# Patient Record
Sex: Male | Born: 1960 | Race: White | Hispanic: No | Marital: Married | Smoking: Former smoker
Health system: Southern US, Community
[De-identification: ages and names within clinical notes are randomized; demographics above are authoritative.]

## PROBLEM LIST (undated history)

## (undated) DIAGNOSIS — I7121 Aneurysm of the ascending aorta, without rupture: Secondary | ICD-10-CM

## (undated) DIAGNOSIS — I719 Aortic aneurysm of unspecified site, without rupture: Secondary | ICD-10-CM

## (undated) DIAGNOSIS — I5022 Chronic systolic (congestive) heart failure: Secondary | ICD-10-CM

## (undated) HISTORY — DX: Aneurysm of the ascending aorta, without rupture: I71.21

## (undated) HISTORY — DX: Aortic aneurysm of unspecified site, without rupture: I71.9

## (undated) HISTORY — DX: Chronic systolic (congestive) heart failure: I50.22

---

## 2014-04-09 ENCOUNTER — Emergency Department (HOSPITAL_COMMUNITY): Payer: BLUE CROSS/BLUE SHIELD

## 2014-04-09 ENCOUNTER — Inpatient Hospital Stay (HOSPITAL_COMMUNITY)
Admission: EM | Admit: 2014-04-09 | Discharge: 2014-04-22 | DRG: 216 | Disposition: A | Discharge status: Home / Self Care | Payer: BLUE CROSS/BLUE SHIELD | Attending: Surgery | Admitting: Surgery

## 2014-04-09 ENCOUNTER — Encounter (HOSPITAL_COMMUNITY): Payer: Self-pay | Admitting: *Deleted

## 2014-04-09 DIAGNOSIS — R0602 Shortness of breath: Secondary | ICD-10-CM | POA: Diagnosis not present

## 2014-04-09 DIAGNOSIS — I5031 Acute diastolic (congestive) heart failure: Secondary | ICD-10-CM | POA: Diagnosis present

## 2014-04-09 DIAGNOSIS — I712 Thoracic aortic aneurysm, without rupture, unspecified: Secondary | ICD-10-CM

## 2014-04-09 DIAGNOSIS — I517 Cardiomegaly: Secondary | ICD-10-CM | POA: Diagnosis present

## 2014-04-09 DIAGNOSIS — Z87891 Personal history of nicotine dependence: Secondary | ICD-10-CM

## 2014-04-09 DIAGNOSIS — J189 Pneumonia, unspecified organism: Secondary | ICD-10-CM | POA: Diagnosis present

## 2014-04-09 DIAGNOSIS — I77810 Thoracic aortic ectasia: Secondary | ICD-10-CM

## 2014-04-09 DIAGNOSIS — L259 Unspecified contact dermatitis, unspecified cause: Secondary | ICD-10-CM | POA: Diagnosis not present

## 2014-04-09 DIAGNOSIS — J90 Pleural effusion, not elsewhere classified: Secondary | ICD-10-CM

## 2014-04-09 DIAGNOSIS — R000 Tachycardia, unspecified: Secondary | ICD-10-CM | POA: Diagnosis present

## 2014-04-09 DIAGNOSIS — I272 Other secondary pulmonary hypertension: Secondary | ICD-10-CM | POA: Diagnosis present

## 2014-04-09 DIAGNOSIS — Z952 Presence of prosthetic heart valve: Secondary | ICD-10-CM

## 2014-04-09 DIAGNOSIS — Z6829 Body mass index (BMI) 29.0-29.9, adult: Secondary | ICD-10-CM

## 2014-04-09 DIAGNOSIS — D72829 Elevated white blood cell count, unspecified: Secondary | ICD-10-CM | POA: Diagnosis present

## 2014-04-09 DIAGNOSIS — I350 Nonrheumatic aortic (valve) stenosis: Secondary | ICD-10-CM

## 2014-04-09 DIAGNOSIS — I351 Nonrheumatic aortic (valve) insufficiency: Secondary | ICD-10-CM

## 2014-04-09 DIAGNOSIS — E669 Obesity, unspecified: Secondary | ICD-10-CM | POA: Diagnosis present

## 2014-04-09 LAB — CBC WITH DIFFERENTIAL/PLATELET
Basophils Absolute: 0 10*3/uL (ref 0.0–0.1)
Basophils Relative: 0 % (ref 0–1)
EOS ABS: 0.1 10*3/uL (ref 0.0–0.7)
EOS PCT: 1 % (ref 0–5)
HEMATOCRIT: 42.5 % (ref 39.0–52.0)
Hemoglobin: 14.4 g/dL (ref 13.0–17.0)
LYMPHS ABS: 2 10*3/uL (ref 0.7–4.0)
LYMPHS PCT: 23 % (ref 12–46)
MCH: 32.7 pg (ref 26.0–34.0)
MCHC: 33.9 g/dL (ref 30.0–36.0)
MCV: 96.4 fL (ref 78.0–100.0)
Monocytes Absolute: 0.8 10*3/uL (ref 0.1–1.0)
Monocytes Relative: 9 % (ref 3–12)
Neutro Abs: 5.7 10*3/uL (ref 1.7–7.7)
Neutrophils Relative %: 67 % (ref 43–77)
Platelets: 210 10*3/uL (ref 150–400)
RBC: 4.41 MIL/uL (ref 4.22–5.81)
RDW: 13.1 % (ref 11.5–15.5)
WBC: 8.6 10*3/uL (ref 4.0–10.5)

## 2014-04-09 LAB — BASIC METABOLIC PANEL
Anion gap: 6 (ref 5–15)
BUN: 14 mg/dL (ref 6–23)
CHLORIDE: 110 meq/L (ref 96–112)
CO2: 22 mmol/L (ref 19–32)
CREATININE: 0.97 mg/dL (ref 0.50–1.35)
Calcium: 9.1 mg/dL (ref 8.4–10.5)
GFR calc Af Amer: >90 mL/min (ref >90)
GFR calc non Af Amer: >90 mL/min (ref >90)
Glucose, Bld: 99 mg/dL (ref 70–99)
Potassium: 4.3 mmol/L (ref 3.5–5.1)
Sodium: 138 mmol/L (ref 135–145)

## 2014-04-09 LAB — BRAIN NATRIURETIC PEPTIDE: B NATRIURETIC PEPTIDE 5: 750 pg/mL — AB (ref 0.0–100.0)

## 2014-04-09 LAB — TROPONIN I: Troponin I: <0.03 ng/mL (ref <0.031)

## 2014-04-09 NOTE — ED Provider Notes (Addendum)
CSN: [id number]     Arrival date & time 04/09/14  2140 History  This chart was scribed for Vida Roller, MD by Annye Asa, ED Scribe. This patient was seen in room APA01/APA01 and the patient's care was started at 10:16 PM.    Chief Complaint  Patient presents with  . Cough   The history is provided by the patient. No language interpreter was used.     HPI Comments: Vincent [name] is an otherwise healthy 54 y.o. male who presents to the Emergency Department complaining of 3 weeks of gradually worsening dry cough. Patient was seen by [facility] at symptom onset, treated with z-pack and cough syrup; his symptoms did not improve. He was seen again by same [facility] today and had a CXR and CT done. The office called and told him to go to the ED due to the fluid in his lungs. He went to [facility] ED with this issue but could not wait, prompting him to come to [facility] ED.   Given Levaquin prior to arrival at [facility] - IV dose.  He denies SOB, fatigue, abnormal night sweats.   History reviewed. No pertinent past medical history. History reviewed. No pertinent past surgical history. History reviewed. No pertinent family history. History  Substance Use Topics  . Smoking status: Former Games developer  . Smokeless tobacco: Not on file  . Alcohol Use: Not on file    Review of Systems  Respiratory: Positive for cough.   All other systems reviewed and are negative.   Allergies  Review of patient's allergies indicates no known allergies.  Home Medications   Prior to Admission medications   Medication Sig Start Date End Date Taking? Authorizing Provider  cefTRIAXone (ROCEPHIN) 250 MG injection Inject 250 mg into the muscle once. FOR IM use in LARGE MUSCLE MASS   Yes Historical Provider, MD  dextromethorphan-guaiFENesin (MUCINEX DM) 30-600 MG per 12 hr tablet Take 1 tablet by mouth 2 (two) times daily as needed for cough.    Yes Historical Provider, MD  guaiFENesin-codeine (ROBITUSSIN AC) 100-10 MG/5ML syrup  Take 5 mLs by mouth 3 (three) times daily as needed for cough.   Yes Historical Provider, MD  levofloxacin (LEVAQUIN) 500 MG tablet Take 500 mg by mouth daily.   Yes Historical Provider, MD   BP 181/74 mmHg  Pulse 116  Temp(Src) 98.1 F (36.7 C) (Oral)  Resp 20  Ht  (1.702 m)  Wt 204 lb (92.534 kg)  BMI 31.94 kg/m2  SpO2 92% Physical Exam  Constitutional: He appears well-developed and well-nourished.  HENT:  Head: Normocephalic and atraumatic.  Eyes: Conjunctivae are normal. Right eye exhibits no discharge. Left eye exhibits no discharge.  Neck: Normal range of motion. Neck supple. No thyromegaly present.  Cardiovascular:  Mild tachypnea   Pulmonary/Chest: Effort normal. No respiratory distress.  Dullness to percussion base of R lung posterior hemithorax. Mild tachypnea.   Abdominal: Soft. Bowel sounds are normal. He exhibits no distension. There is no tenderness. There is no rebound.  Musculoskeletal: He exhibits no edema.  Lymphadenopathy:    He has no cervical adenopathy.  Neurological: He is alert. Coordination normal.  Skin: Skin is warm and dry. No rash noted. He is not diaphoretic. No erythema.  Psychiatric: He has a normal mood and affect.  Nursing note and vitals reviewed.   ED Course  Procedures   DIAGNOSTIC STUDIES: Oxygen Saturation is 92% on RA, low by my interpretation.    COORDINATION OF CARE: 10:32  PM Discussed treatment plan with pt at bedside and pt agreed to plan.  Labs Review Labs Reviewed  CBC WITH DIFFERENTIAL  BASIC METABOLIC PANEL    Imaging Review Dg Chest 2 View  04/09/2014   CLINICAL DATA:  Dry cough and congestion for 3 weeks. Patient was seen in [place] when symptoms first began and then again today. Increased shortness of breath tonight. Former smoker.  EXAM: CHEST  2 VIEW  COMPARISON:  None.  FINDINGS: Cardiac enlargement with mild pulmonary vascular congestion. Small bilateral pleural effusions. Patchy nodular opacities  in the lung bases suggest early edema. Findings most consistent with congestive failure although diffuse interstitial pneumonia could also have this appearance. No focal consolidation. No pneumothorax. Mediastinal contours appear intact.  IMPRESSION: Cardiac enlargement with mild pulmonary vascular congestion and probable nodular interstitial edema. Small bilateral pleural effusions.   Electronically Signed   By: Burman Nieves M.D.   On: 04/09/2014 23:18    EKG Interpretation  Date/Time:  Monday April 09 2014 23:35:22 EST Ventricular Rate:  103 PR Interval:  168 QRS Duration: 110 QT Interval:  380 QTC Calculation: 497 R Axis:   -60 Text Interpretation:  Sinus tachycardia Possible Left atrial enlargement Right bundle branch block Left anterior fascicular block  Bifascicular block   Abnormal ECG No old tracing to compare  Confirmed by Asa Baudoin  MD, Kanoe Wanner ([id number]) on 04/09/2014 11:47:41 PM       MDM   Final diagnoses:  SOB (shortness of breath)  Pleural effusion    Hypoxic with an oxygen of 92%, mildly hypertensive, I have reviewed his CT scan from the outside hospital, the report was sent and shows that there is patchy left upper and right lower lobe groundglass densities, also present or pleural effusions, they questioned patchy pulmonary edema versus infectious or inflammatory process. I discussed these findings with the hospitalist, Dr. Selena Batten, he will admit the patient to the hospital. A bedside ultrasound performed by me showed a right-sided pleural effusion.  Emergency Ultrasound: Limited Thoracic Performed and interpreted by Dr Hyacinth Meeker Longitudinal view of anterior left and right lung fields in real-time with linear probe. Indication: Dullness to percussion and SOB Findings: pleural effusion on the L Interpretation: no evidence of pneumothorax.  I personally performed the services described in this documentation, which was scribed in my presence. The recorded information has been  reviewed and is accurate.         Vida Roller, MD 04/09/14 1191  Vida Roller, MD 04/09/14 [phone]

## 2014-04-09 NOTE — ED Notes (Signed)
Pt was seen at his regular PCP and had an xray done; the office called and told pt to come to ED because he has fluid in his lungs; pt states he has a dry cough and has had a dry cough x 3 weeks

## 2014-04-10 ENCOUNTER — Encounter (HOSPITAL_COMMUNITY): Payer: Self-pay | Admitting: Internal Medicine

## 2014-04-10 DIAGNOSIS — I351 Nonrheumatic aortic (valve) insufficiency: Secondary | ICD-10-CM | POA: Diagnosis present

## 2014-04-10 DIAGNOSIS — I5031 Acute diastolic (congestive) heart failure: Secondary | ICD-10-CM | POA: Diagnosis present

## 2014-04-10 DIAGNOSIS — Z6829 Body mass index (BMI) 29.0-29.9, adult: Secondary | ICD-10-CM | POA: Diagnosis not present

## 2014-04-10 DIAGNOSIS — R000 Tachycardia, unspecified: Secondary | ICD-10-CM | POA: Diagnosis present

## 2014-04-10 DIAGNOSIS — I77810 Thoracic aortic ectasia: Secondary | ICD-10-CM | POA: Diagnosis present

## 2014-04-10 DIAGNOSIS — E669 Obesity, unspecified: Secondary | ICD-10-CM | POA: Diagnosis present

## 2014-04-10 DIAGNOSIS — L259 Unspecified contact dermatitis, unspecified cause: Secondary | ICD-10-CM | POA: Diagnosis not present

## 2014-04-10 DIAGNOSIS — R0602 Shortness of breath: Secondary | ICD-10-CM | POA: Insufficient documentation

## 2014-04-10 DIAGNOSIS — Z87891 Personal history of nicotine dependence: Secondary | ICD-10-CM | POA: Diagnosis not present

## 2014-04-10 DIAGNOSIS — I359 Nonrheumatic aortic valve disorder, unspecified: Secondary | ICD-10-CM

## 2014-04-10 DIAGNOSIS — J189 Pneumonia, unspecified organism: Secondary | ICD-10-CM | POA: Diagnosis present

## 2014-04-10 DIAGNOSIS — I712 Thoracic aortic aneurysm, without rupture: Secondary | ICD-10-CM | POA: Diagnosis present

## 2014-04-10 DIAGNOSIS — D72829 Elevated white blood cell count, unspecified: Secondary | ICD-10-CM | POA: Diagnosis present

## 2014-04-10 DIAGNOSIS — I272 Other secondary pulmonary hypertension: Secondary | ICD-10-CM | POA: Diagnosis present

## 2014-04-10 DIAGNOSIS — I517 Cardiomegaly: Secondary | ICD-10-CM | POA: Diagnosis present

## 2014-04-10 LAB — TSH: TSH: 2.761 u[IU]/mL (ref 0.350–4.500)

## 2014-04-10 LAB — MRSA PCR SCREENING: MRSA by PCR: NEGATIVE

## 2014-04-10 LAB — TROPONIN I
TROPONIN I: 0.03 ng/mL (ref <0.031)
Troponin I: <0.03 ng/mL (ref <0.031)

## 2014-04-10 MED ORDER — HYDROCOD POLST-CHLORPHEN POLST 10-8 MG/5ML PO LQCR: 5.0000 mL | Freq: Two times a day (BID) | ORAL | Status: DC | PRN

## 2014-04-10 MED ORDER — ONDANSETRON HCL 4 MG/2ML IJ SOLN: 4.0000 mg | Freq: Three times a day (TID) | INTRAMUSCULAR | Status: AC | PRN

## 2014-04-10 MED ORDER — SODIUM CHLORIDE 0.9 % IJ SOLN
3.0000 mL | Freq: Two times a day (BID) | INTRAMUSCULAR | Status: DC
  Administered 2014-04-11 – 2014-04-12 (×2): 3 mL via INTRAVENOUS

## 2014-04-10 MED ORDER — LEVOFLOXACIN 750 MG PO TABS
750.0000 mg | ORAL_TABLET | Freq: Every day | ORAL | Status: DC
  Administered 2014-04-10 – 2014-04-11 (×2): 750 mg via ORAL
  Filled 2014-04-10 (×2): qty 1

## 2014-04-10 MED ORDER — INFLUENZA VAC SPLIT QUAD 0.5 ML IM SUSY
0.5000 mL | PREFILLED_SYRINGE | INTRAMUSCULAR | Status: DC
  Filled 2014-04-10 (×2): qty 0.5

## 2014-04-10 MED ORDER — SODIUM CHLORIDE 0.9 % IJ SOLN
3.0000 mL | Freq: Two times a day (BID) | INTRAMUSCULAR | Status: DC
  Administered 2014-04-10 – 2014-04-11 (×5): 3 mL via INTRAVENOUS

## 2014-04-10 MED ORDER — SIMETHICONE 40 MG/0.6ML PO SUSP
ORAL | Status: AC
  Filled 2014-04-10: qty 0.6

## 2014-04-10 MED ORDER — ENOXAPARIN SODIUM 40 MG/0.4ML ~~LOC~~ SOLN
40.0000 mg | SUBCUTANEOUS | Status: DC
  Administered 2014-04-10 – 2014-04-11 (×2): 40 mg via SUBCUTANEOUS
  Filled 2014-04-10 (×4): qty 0.4

## 2014-04-10 MED ORDER — LISINOPRIL 2.5 MG PO TABS
2.5000 mg | ORAL_TABLET | Freq: Every day | ORAL | Status: DC
  Administered 2014-04-10 – 2014-04-12 (×3): 2.5 mg via ORAL
  Filled 2014-04-10 (×4): qty 1

## 2014-04-10 MED ORDER — SODIUM CHLORIDE 0.9 % IJ SOLN: 3.0000 mL | INTRAMUSCULAR | Status: DC | PRN

## 2014-04-10 MED ORDER — FUROSEMIDE 10 MG/ML IJ SOLN
20.0000 mg | Freq: Every day | INTRAMUSCULAR | Status: DC
  Administered 2014-04-10 – 2014-04-12 (×3): 20 mg via INTRAVENOUS
  Filled 2014-04-10 (×4): qty 2

## 2014-04-10 MED ORDER — SODIUM CHLORIDE 0.9 % IV SOLN: 250.0000 mL | INTRAVENOUS | Status: DC | PRN

## 2014-04-10 NOTE — Consult Note (Signed)
Consulting cardiologist: Dr Dina RichJonathan Lakevia Perris MD  Clinical Summary [name] is a 54 y.o.male [name] is a 54 y.o.male with no previous cardiac history admitted for SOB and cough for several weeks. Tried on abx as outpatient without any improvement. [place] was [place] in [place] CT which showed no PE, and only patchy left upper and right lower lobe ground glass opacities according to H&P(actual radioltogy report not available in epic or paper chart). He denies any chest pain. 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    CXR cardiomegaly, mild pulm edema. EKG sinus tach, RBBB, LAFB, LAE Trop neg x 4, TSH 2.7, Hgb 14.4, WBC 8.6, Plt 210, BNP 750    No Known Allergies  Medications Scheduled Medications: . enoxaparin (LOVENOX) injection  40 mg Subcutaneous Q24H  . furosemide  20 mg Intravenous Daily  . [START ON 04/11/2014] Influenza vac split quadrivalent PF  0.5 mL Intramuscular Tomorrow-1000  . levofloxacin  750 mg Oral Daily  . lisinopril  2.5 mg Oral Daily  . simethicone      . sodium chloride  3 mL Intravenous Q12H  . sodium chloride  3 mL Intravenous Q12H     Infusions:     PRN Medications:  sodium chloride, chlorpheniramine-HYDROcodone, sodium chloride   History reviewed. No pertinent past medical history.  History reviewed. No pertinent past surgical history.  Family History  Problem Relation Age of Onset  . Diabetes Father   . Diabetes Sister     Social History [name] reports that he has quit smoking. His smoking use included Cigarettes. He has a 2.5 pack-year smoking history. He does not have any smokeless tobacco history on file. [name] reports that he does not drink alcohol.  Review of Systems CONSTITUTIONAL: No weight loss, fever, chills, weakness or fatigue.  HEENT: Eyes: No visual loss, blurred vision, double vision or yellow sclerae. No hearing loss,  sneezing, congestion, runny nose or sore throat.  SKIN: No rash or itching.  CARDIOVASCULAR: per HPI RESPIRATORY: No shortness of breath, cough or sputum.  GASTROINTESTINAL: No anorexia, nausea, vomiting or diarrhea. No abdominal pain or blood.  GENITOURINARY: no polyuria, no dysuria NEUROLOGICAL: No headache, dizziness, syncope, paralysis, ataxia, numbness or tingling in the extremities. No change in bowel or bladder control.  MUSCULOSKELETAL: No muscle, back pain, joint pain or stiffness.  HEMATOLOGIC: No anemia, bleeding or bruising.  LYMPHATICS: No enlarged nodes. No history of splenectomy.  PSYCHIATRIC: No history of depression or anxiety.      Physical Examination Blood pressure 133/63, pulse 103, temperature 97.8 F (36.6 C), temperature source Oral, resp. rate 20, height 5\' 7"  (1.702 m), weight 207 lb 11.2 oz (94.212 kg), SpO2 97 %.  Intake/Output Summary (Last 24 hours) at 04/10/14 1646 Last data filed at 04/10/14 1339  Gross per 24 hour  Intake    480 ml  Output   2350 ml  Net  -1870 ml    HEENT: sclera clear  Cardiovascular: regular, rate 105, 3/6 diastolic murmur RUSB, no JVD  Respiratory: CTAB  GI: abdomen soft, NT, ND  MSK: trace bilateral edema  Neuro: no focal deficits  Psych: appropriate affect   Lab Results  Basic Metabolic Panel:  Recent Labs Lab 04/09/14 2255  NA 138  K 4.3  CL 110  CO2 22  GLUCOSE 99  BUN 14  CREATININE 0.97  CALCIUM 9.1  Liver Function Tests: No results for input(s): AST, ALT, ALKPHOS, BILITOT, PROT, ALBUMIN in the last 168 hours.  CBC:  Recent Labs Lab 04/09/14 2255  WBC 8.6  NEUTROABS 5.7  HGB 14.4  HCT 42.5  MCV 96.4  PLT 210    Cardiac Enzymes:  Recent Labs Lab 04/09/14 2255 04/10/14 0104 04/10/14 0641 04/10/14 1227  TROPONINI <0.03 <0.03 <0.03 0.03    BNP: Invalid input(s): POCBNP     Impression/Recommendations 1. Aortic root aneurysm with severe AI - severe dilatation primarily  of right Sinus of Valsalva with resulting severe AI. The rest of the aorta is poorly visualized by echo, he is to get a CTA of entire thoracic and abdominal aorta to better evaluate. Unclear from 2D echo if possible bicuspid valve and if could be related to aortopathy, will have TEE arranged at [facility]. Will also need RHC/LHC and CT surgery consultation as well.  - likely subacute AI due to gradual onset of symptoms and ventricular dilatation - discussed with hospitalist, they are to arrange transfer to [facility] and CT scan, we will follow as consult. Spoke with cardio coordinator to arrange TEE tomorrow, potentially RHC/LHC if can be scheduled. - continue IV diuretics for now  2. Sinus tachycardia - secondary to AI and mild CHF, given severe AI would not slow it down. He is otherwise stable with limited symptoms.      Dina Rich, M.D.

## 2014-04-10 NOTE — Progress Notes (Signed)
PROGRESS NOTE  Vincent [name] RUE:[id number] DOB: 11-01-1960 DOA: 04/09/2014 PCP: No primary care provider on file.  HPI/Recap of past 24 hours: 54 yo male with history of obesity and who gets his medical care done through urgent care, admitted on 1/5 early morning for persistent cough despite being treated for pneumonia 2 weeks prior. CT angiogram done at care noted ? Persistent areas of pneumonia, pleural effusion and cardiomegaly. Patient brought in for further evaluation and workup. BNP on admission noted at 740.  Following morning, patient feeling about the same. Minimal nonproductive cough. Echocardiogram done noted severe aortic regurgitation and enlarged aortic root.  Spoke with cardiology coming agreed the patient should be transferred to [facility] for heart catheterization and evaluation by cardiothoracic surgery.  Assessment/Plan: Principal Problem:   Aortic root dilatation/  Severe aortic regurgitation w/cradiomegaly, pleural effusion: Patient to be transferred [facility] stepdown. Notify cardiology there plus cardiothoracic surgery. Active Problems:    Pneumonia: Given diagnosis seen on echo, no leukocytosis or fever, feel that this issue is really cardiac and not infectious. We'll discontinue antibiotics.    Obesity (BMI 30-39.9): Patient meets criteria with BMI greater than 30   Code Status: Full code  Family Communication: Wife at bedside  Disposition Plan: [facility]   Consultants:  Cardiology  Will notify cardiothoracic surgery  Procedures:  Echocardiogram done 1/5: Enlarged aortic root, severe aortic regurg, ejection fraction 50-55 percent, unable to comment on diastolic dysfunction  Antibiotics:  Levaquin 1/4-1/5   Objective: BP 133/63 mmHg  Pulse 103  Temp(Src) 97.8 F (36.6 C) (Oral)  Resp 20  Ht 5\' 7"  (1.702 m)  Wt 94.212 kg (207 lb 11.2 oz)  BMI 32.52 kg/m2  SpO2 97%  Intake/Output Summary (Last 24 hours) at 04/10/14  1641 Last data filed at 04/10/14 1339  Gross per 24 hour  Intake    480 ml  Output   2350 ml  Net  -1870 ml   Filed Weights   04/09/14 2146 04/10/14 0110 04/10/14 1028  Weight: 92.534 kg (204 lb) 93.985 kg (207 lb 3.2 oz) 94.212 kg (207 lb 11.2 oz)    Exam:   General:  Alert and oriented 3, no acute distress  Cardiovascular: Regular rate and rhythm, S1-S2, 2/6 systolic ejection murmur  Respiratory: Clear to auscultation bilaterally  Abdomen: Soft, obese, nontender, positive bowel sounds  Musculoskeletal: No clubbing or cyanosis, trace edema   Data Reviewed: Basic Metabolic Panel:  Recent Labs Lab 04/09/14 2255  NA 138  K 4.3  CL 110  CO2 22  GLUCOSE 99  BUN 14  CREATININE 0.97  CALCIUM 9.1   Liver Function Tests: No results for input(s): AST, ALT, ALKPHOS, BILITOT, PROT, ALBUMIN in the last 168 hours. No results for input(s): LIPASE, AMYLASE in the last 168 hours. No results for input(s): AMMONIA in the last 168 hours. CBC:  Recent Labs Lab 04/09/14 2255  WBC 8.6  NEUTROABS 5.7  HGB 14.4  HCT 42.5  MCV 96.4  PLT 210   Cardiac Enzymes:    Recent Labs Lab 04/09/14 2255 04/10/14 0104 04/10/14 0641 04/10/14 1227  TROPONINI <0.03 <0.03 <0.03 0.03   BNP (last 3 results) No results for input(s): PROBNP in the last 8760 hours. CBG: No results for input(s): GLUCAP in the last 168 hours.  No results found for this or any previous visit (from the past 240 hour(s)).   Studies: Dg Chest 2 View  04/09/2014   CLINICAL DATA:  Dry cough  and congestion for 3 weeks. Patient was seen in [place] when symptoms first began and then again today. Increased shortness of breath tonight. Former smoker.  EXAM: CHEST  2 VIEW  COMPARISON:  None.  FINDINGS: Cardiac enlargement with mild pulmonary vascular congestion. Small bilateral pleural effusions. Patchy nodular opacities in the lung bases suggest early edema. Findings most consistent with congestive failure  although diffuse interstitial pneumonia could also have this appearance. No focal consolidation. No pneumothorax. Mediastinal contours appear intact.  IMPRESSION: Cardiac enlargement with mild pulmonary vascular congestion and probable nodular interstitial edema. Small bilateral pleural effusions.   Electronically Signed   By: Burman Nieves M.D.   On: 04/09/2014 23:18    Scheduled Meds: . enoxaparin (LOVENOX) injection  40 mg Subcutaneous Q24H  . furosemide  20 mg Intravenous Daily  . [START ON 04/11/2014] Influenza vac split quadrivalent PF  0.5 mL Intramuscular Tomorrow-1000  . levofloxacin  750 mg Oral Daily  . lisinopril  2.5 mg Oral Daily  . simethicone      . sodium chloride  3 mL Intravenous Q12H  . sodium chloride  3 mL Intravenous Q12H    Continuous Infusions:    Time spent: 25 minutes  Hollice Espy  [facility] Pager [phone]. If 7PM-7AM, please contact night-coverage at [email or web address], password [facility] 04/10/2014, 4:41 PM  LOS: 1 day

## 2014-04-10 NOTE — Progress Notes (Signed)
[facility] on 2 central.  Nurse verbalized understanding.

## 2014-04-10 NOTE — Progress Notes (Addendum)
Pt arrived from [facility]. Pt was cleansed with CHG wipes and MRSA swab was sent to lab.  Vitals signs: BP 138/59, HR 108 and on room air. Will continue to monitor.   Alba DestineMisty L Ennis, RN, BSN

## 2014-04-10 NOTE — H&P (Addendum)
Vincent [name] is an 54 y.o. male.    pcp: [facility]  Chief Complaint: cough HPI: 54yo male with cough starting about 2 weeks ago,  tx with [facility] in [place].  His cough was persistent and therefore pt presented to [facility].  CTA showed negative pe and some patchy left upper and right lower and left lower lobe ground glass and right greater than left pleural effusion along with cardiomegaly.  Pt denies fever, chills, cp, palp, sob, n/v, diarrhea.  Pt will be admitted for w/up of ? Pneumonia, and pleural effusion.    History reviewed. No pertinent past medical history.  None per pt  History reviewed. No pertinent past surgical history.  None per pt  Family History  Problem Relation Age of Onset  . Diabetes Father   . Diabetes Sister    Social History:  reports that he has quit smoking. His smoking use included Cigarettes. He has a 2.5 pack-year smoking history. He does not have any smokeless tobacco history on file. He reports that he does not drink alcohol or use illicit drugs.  Allergies: No Known Allergies   (Not in a hospital admission)  Results for orders placed or performed during the hospital encounter of 04/09/14 (from the past 48 hour(s))  CBC with Differential     Status: None   Collection Time: 04/09/14 10:55 PM  Result Value Ref Range   WBC 8.6 4.0 - 10.5 K/uL   RBC 4.41 4.22 - 5.81 MIL/uL   Hemoglobin 14.4 13.0 - 17.0 g/dL   HCT 42.5 39.0 - 52.0 %   MCV 96.4 78.0 - 100.0 fL   MCH 32.7 26.0 - 34.0 pg   MCHC 33.9 30.0 - 36.0 g/dL   RDW 13.1 11.5 - 15.5 %   Platelets 210 150 - 400 K/uL   Neutrophils Relative % 67 43 - 77 %   Neutro Abs 5.7 1.7 - 7.7 K/uL   Lymphocytes Relative 23 12 - 46 %   Lymphs Abs 2.0 0.7 - 4.0 K/uL   Monocytes Relative 9 3 - 12 %   Monocytes Absolute 0.8 0.1 - 1.0 K/uL   Eosinophils Relative 1 0 - 5 %   Eosinophils Absolute 0.1 0.0 - 0.7 K/uL   Basophils Relative 0 0 - 1 %   Basophils Absolute 0.0 0.0 - 0.1  K/uL  Basic metabolic panel     Status: None   Collection Time: 04/09/14 10:55 PM  Result Value Ref Range   Sodium 138 135 - 145 mmol/L    Comment: Please note change in reference range.   Potassium 4.3 3.5 - 5.1 mmol/L    Comment: Please note change in reference range.   Chloride 110 96 - 112 mEq/L   CO2 22 19 - 32 mmol/L   Glucose, Bld 99 70 - 99 mg/dL   BUN 14 6 - 23 mg/dL   Creatinine, Ser 0.97 0.50 - 1.35 mg/dL   Calcium 9.1 8.4 - 10.5 mg/dL   GFR calc non Af Amer >90 >90 mL/min   GFR calc Af Amer >90 >90 mL/min    Comment: (NOTE) The eGFR has been calculated using the CKD EPI equation. This calculation has not been validated in all clinical situations. eGFR's persistently <90 mL/min signify possible Chronic Kidney Disease.    Anion gap 6 5 - 15  Brain natriuretic peptide     Status: Abnormal   Collection Time: 04/09/14 10:55 PM  Result Value Ref  Range   B Natriuretic Peptide 750.0 (H) 0.0 - 100.0 pg/mL    Comment: Please note change in reference range.  Troponin I     Status: None   Collection Time: 04/09/14 10:55 PM  Result Value Ref Range   Troponin I <0.03 <0.031 ng/mL    Comment:        NO INDICATION OF MYOCARDIAL INJURY. Please note change in reference range.    Dg Chest 2 View  04/09/2014   CLINICAL DATA:  Dry cough and congestion for 3 weeks. Patient was seen in [place] when symptoms first began and then again today. Increased shortness of breath tonight. Former smoker.  EXAM: CHEST  2 VIEW  COMPARISON:  None.  FINDINGS: Cardiac enlargement with mild pulmonary vascular congestion. Small bilateral pleural effusions. Patchy nodular opacities in the lung bases suggest early edema. Findings most consistent with congestive failure although diffuse interstitial pneumonia could also have this appearance. No focal consolidation. No pneumothorax. Mediastinal contours appear intact.  IMPRESSION: Cardiac enlargement with mild pulmonary vascular congestion and  probable nodular interstitial edema. Small bilateral pleural effusions.   Electronically Signed   By: Lucienne Capers M.D.   On: 04/09/2014 23:18    Review of Systems  Constitutional: Negative for fever, chills, weight loss, malaise/fatigue and diaphoresis.  HENT: Negative for congestion, ear discharge, ear pain, hearing loss, nosebleeds, sore throat and tinnitus.   Eyes: Negative for blurred vision, double vision, photophobia, pain, discharge and redness.  Respiratory: Positive for cough. Negative for hemoptysis, sputum production, shortness of breath, wheezing and stridor.   Cardiovascular: Negative for chest pain, palpitations, orthopnea, claudication, leg swelling and PND.  Gastrointestinal: Negative for heartburn, nausea, vomiting, abdominal pain, diarrhea, constipation, blood in stool and melena.  Genitourinary: Negative for dysuria, urgency, frequency, hematuria and flank pain.  Musculoskeletal: Negative for myalgias, back pain, joint pain, falls and neck pain.  Skin: Negative for itching and rash.  Neurological: Negative for dizziness, tingling, tremors, sensory change, speech change, focal weakness, seizures, loss of consciousness, weakness and headaches.  Endo/Heme/Allergies: Negative for environmental allergies and polydipsia. Does not bruise/bleed easily.  Psychiatric/Behavioral: Negative for depression, suicidal ideas, hallucinations, memory loss and substance abuse. The patient is not nervous/anxious and does not have insomnia.     Blood pressure 181/74, pulse 116, temperature 98.1 F (36.7 C), temperature source Oral, resp. rate 20, height '5\' 7"'  (1.702 m), weight 92.534 kg (204 lb), SpO2 92 %. Physical Exam  Constitutional: He is oriented to person, place, and time. He appears well-developed and well-nourished.  HENT:  Head: Normocephalic and atraumatic.  Eyes: Conjunctivae and EOM are normal. Pupils are equal, round, and reactive to light.  Neck: Normal range of motion. Neck  supple. No JVD present. No tracheal deviation present. No thyromegaly present.  Cardiovascular: Regular rhythm.  Exam reveals no gallop and no friction rub.   Murmur heard. Tachycardic s1, s2, 2/6 sem apex  Respiratory: Effort normal and breath sounds normal. No respiratory distress. He has no wheezes. He has no rales.  GI: Soft. Bowel sounds are normal. He exhibits no distension. There is no tenderness. There is no rebound and no guarding.  Musculoskeletal: Normal range of motion. He exhibits no edema or tenderness.  Lymphadenopathy:    He has no cervical adenopathy.  Neurological: He is alert and oriented to person, place, and time. He has normal reflexes. He displays normal reflexes. No cranial nerve deficit. He exhibits normal muscle tone. Coordination normal.  Skin: Skin is warm and dry.  No rash noted. No erythema. No pallor.  Psychiatric: He has a normal mood and affect. His behavior is normal. Judgment and thought content normal.     Assessment/Plan Cough possibly secondary to CHF  Vs pneumonia  Pneumonia tx with levaquin 778m iv qday Blood culture x2,  No sputum cx due to lack of productive cough  CHF Trop i q6h x 3,  Cardiac echo  tsh Lasix 22miv  qday Lisinopril 2.66m78mo qday Consider b blocker once acute chf tx.   Cardiac murmer/ Cardiomegaly Check echo  Tachycardia CTA chest negative for PE.   Check tsh, cycle cardiac markers    KIMJani Gravel5/2016, 12:08 AM

## 2014-04-10 NOTE — Progress Notes (Signed)
*  PRELIMINARY RESULTS* Echocardiogram 2D Echocardiogram has been performed.  Jeryl ColumbiaLLIOTT, Varvara Legault 04/10/2014, 3:28 PM

## 2014-04-10 NOTE — Progress Notes (Signed)
Pt off unit. Transfer to [facility] unit via [facility].

## 2014-04-11 ENCOUNTER — Inpatient Hospital Stay (HOSPITAL_COMMUNITY): Payer: BLUE CROSS/BLUE SHIELD

## 2014-04-11 DIAGNOSIS — J90 Pleural effusion, not elsewhere classified: Secondary | ICD-10-CM

## 2014-04-11 MED ORDER — IOHEXOL 350 MG/ML SOLN
100.0000 mL | Freq: Once | INTRAVENOUS | Status: AC | PRN
  Administered 2014-04-11: 100 mL via INTRAVENOUS

## 2014-04-11 NOTE — Progress Notes (Signed)
Per Dr. Jens Somrenshaw, will cancel TEE for tomorrow. Keep NPO past midnight. Will decide in AM whether to proceed with L&R heart cath tomorrow.   Ramond DialSigned, Brodin Gelpi PA Pager: [phone]

## 2014-04-11 NOTE — Progress Notes (Signed)
Chart reviewed.    PROGRESS NOTE  Casimer BilisDanny R Iyengar ZOX:[id number] DOB: 1960-08-13 DOA: 04/09/2014 PCP: No primary care provider on file.  HPI/Recap of past 24 hours: 54 yo male with history of obesity and who gets his medical care done through urgent care, admitted on 1/5 early morning for persistent cough despite being treated for pneumonia 2 weeks prior. CT angiogram done at care noted ? Persistent areas of pneumonia, pleural effusion and cardiomegaly. Patient brought in for further evaluation and workup. BNP on admission noted at 740.  Following morning, patient feeling about the same. Minimal nonproductive cough. Echocardiogram done noted severe aortic regurgitation and enlarged aortic root.  Spoke with cardiology coming agreed the patient should be [facility] for heart catheterization and evaluation by cardiothoracic surgery.  Assessment/Plan: Principal Problem:   Aortic root dilatation/  Severe aortic regurgitation w/cradiomegaly, pleural effusion: continue lasix. CT C/A/P reviewed. Workup per cardiology/TCTS. Transfer to tele Active Problems:    Doubt pneumonia. abx stopped    Code Status: Full code  Family Communication: multiple at bedside  Disposition Plan:    Consultants:  Cardiology  Will notify cardiothoracic surgery  Procedures:  Echocardiogram done 1/5: Enlarged aortic root, severe aortic regurg, ejection fraction 50-55 percent, unable to comment on diastolic dysfunction  Antibiotics:  Levaquin 1/4-1/5   Objective: BP 130/53 mmHg  Pulse 101  Temp(Src) 98.3 F (36.8 C) (Oral)  Resp 28  Ht 5\' 7"  (1.702 m)  Wt 94.7 kg (208 lb 12.4 oz)  BMI 32.69 kg/m2  SpO2 95%  Intake/Output Summary (Last 24 hours) at 04/11/14 1204 Last data filed at 04/11/14 1035  Gross per 24 hour  Intake    343 ml  Output   1500 ml  Net  -1157 ml   Filed Weights   04/10/14 0110 04/10/14 1028 04/11/14 0500  Weight: 93.985 kg (207 lb 3.2 oz) 94.212 kg (207 lb 11.2  oz) 94.7 kg (208 lb 12.4 oz)    Exam:   General:  Alert and oriented 3, no acute distress  Cardiovascular: Regular rate and rhythm, S1-S2, 2/6 murmer  Respiratory: Clear to auscultation bilaterally  Abdomen: Soft, obese, nontender, positive bowel sounds  Musculoskeletal: No clubbing or cyanosis, trace edema   Data Reviewed: Basic Metabolic Panel:  Recent Labs Lab 04/09/14 2255  NA 138  K 4.3  CL 110  CO2 22  GLUCOSE 99  BUN 14  CREATININE 0.97  CALCIUM 9.1   Liver Function Tests: No results for input(s): AST, ALT, ALKPHOS, BILITOT, PROT, ALBUMIN in the last 168 hours. No results for input(s): LIPASE, AMYLASE in the last 168 hours. No results for input(s): AMMONIA in the last 168 hours. CBC:  Recent Labs Lab 04/09/14 2255  WBC 8.6  NEUTROABS 5.7  HGB 14.4  HCT 42.5  MCV 96.4  PLT 210   Cardiac Enzymes:    Recent Labs Lab 04/09/14 2255 04/10/14 0104 04/10/14 0641 04/10/14 1227  TROPONINI <0.03 <0.03 <0.03 0.03   BNP (last 3 results) No results for input(s): PROBNP in the last 8760 hours. CBG: No results for input(s): GLUCAP in the last 168 hours.  Recent Results (from the past 240 hour(s))  MRSA PCR Screening     Status: None   Collection Time: 04/10/14  9:41 PM  Result Value Ref Range Status   MRSA by PCR NEGATIVE NEGATIVE Final    Comment:        The GeneXpert MRSA Assay (FDA approved for NASAL specimens only), is one component of  a comprehensive MRSA colonization surveillance program. It is not intended to diagnose MRSA infection nor to guide or monitor treatment for MRSA infections.      Studies: No results found.  Scheduled Meds: . enoxaparin (LOVENOX) injection  40 mg Subcutaneous Q24H  . furosemide  20 mg Intravenous Daily  . Influenza vac split quadrivalent PF  0.5 mL Intramuscular Tomorrow-1000  . levofloxacin  750 mg Oral Daily  . lisinopril  2.5 mg Oral Daily  . sodium chloride  3 mL Intravenous Q12H  . sodium  chloride  3 mL Intravenous Q12H    Continuous Infusions:    Time spent: 25 minutes  Ceclia Koker L  [facility] [email or web address], password [facility] 04/11/2014, 12:04 PM  LOS: 2 days

## 2014-04-11 NOTE — Progress Notes (Signed)
Pt still NPO pending any cardiac tests today. Family and pt awaiting Cardiologist rounding.

## 2014-04-11 NOTE — Progress Notes (Signed)
    [facility] has been requested to perform a transesophageal echocardiogram on 04/11/2013 for possible bicuspid aortic valve, severe AI, severely dilated aortic root.  After careful review of history and examination, the risks and benefits of transesophageal echocardiogram have been explained including risks of esophageal damage, perforation (1:10,000 risk), bleeding, pharyngeal hematoma as well as other potential complications associated with conscious sedation including aspiration, arrhythmia, respiratory failure and death. Alternatives to treatment were discussed, questions were answered. Patient is willing to proceed.   Azalee CourseMeng, Fong Mccarry, PA-C 04/11/2014 1:01 PM

## 2014-04-11 NOTE — Progress Notes (Addendum)
Patient Name: Vincent [name] Date of Encounter: 04/11/2014     Principal Problem:   Aortic root dilatation Active Problems:   Pleural effusion   Cardiomegaly   Pneumonia   Severe aortic regurgitation   Obesity (BMI 30-39.9)   SOB (shortness of breath)    SUBJECTIVE  Denies any SOB or CP.   CURRENT MEDS . enoxaparin (LOVENOX) injection  40 mg Subcutaneous Q24H  . furosemide  20 mg Intravenous Daily  . Influenza vac split quadrivalent PF  0.5 mL Intramuscular Tomorrow-1000  . levofloxacin  750 mg Oral Daily  . lisinopril  2.5 mg Oral Daily  . sodium chloride  3 mL Intravenous Q12H  . sodium chloride  3 mL Intravenous Q12H    OBJECTIVE  Filed Vitals:   04/11/14 0400 04/11/14 0500 04/11/14 0802 04/11/14 1034  BP: 124/60  129/58 130/53  Pulse: 98  101   Temp: 98.2 F (36.8 C)  98.3 F (36.8 C)   TempSrc: Oral  Oral   Resp:   28   Height:      Weight:  208 lb 12.4 oz (94.7 kg)    SpO2: 100%  95%     Intake/Output Summary (Last 24 hours) at 04/11/14 1127 Last data filed at 04/11/14 1035  Gross per 24 hour  Intake    243 ml  Output   2250 ml  Net  -2007 ml   Filed Weights   04/10/14 0110 04/10/14 1028 04/11/14 0500  Weight: 207 lb 3.2 oz (93.985 kg) 207 lb 11.2 oz (94.212 kg) 208 lb 12.4 oz (94.7 kg)    PHYSICAL EXAM  General: Pleasant, NAD. Neuro: Alert and oriented X 3. Moves all extremities spontaneously. Psych: Normal affect. HEENT:  Normal  Neck: Supple without bruits  +JVD. Lungs:  Resp regular and unlabored, bibasilar rale, worse on L base Heart: mildly tachycardic no s3, s4. 2/6 systolic and diastolic murmur Abdomen: Soft, non-tender, non-distended, BS + x 4.  Extremities: No clubbing, cyanosis or edema. DP/PT/Radials 2+ and equal bilaterally.  Accessory Clinical Findings  CBC  Recent Labs  04/09/14 2255  WBC 8.6  NEUTROABS 5.7  HGB 14.4  HCT 42.5  MCV 96.4  PLT 210   Basic Metabolic Panel  Recent Labs  04/09/14 2255  NA 138    K 4.3  CL 110  CO2 22  GLUCOSE 99  BUN 14  CREATININE 0.97  CALCIUM 9.1   Cardiac Enzymes  Recent Labs  04/10/14 0104 04/10/14 0641 04/10/14 1227  TROPONINI <0.03 <0.03 0.03   Thyroid Function Tests  Recent Labs  04/10/14 0104  TSH 2.761    TELE Sinus tach with HR 90-100    ECG  No new EKG. Previous EKG sinus tach with RBBB  Echocardiogram 04/10/2014  LV EF: 50% -  55%  ------------------------------------------------------------------- Indications:   Murmur 785.2.  ------------------------------------------------------------------- History:  PMH: Former Smoker, Pneumonia, Cardiomegaly, Plueral Effusion.  ------------------------------------------------------------------- Study Conclusions  - Left ventricle: The cavity size was normal. Wall thickness was normal. Systolic function was normal. The estimated ejection fraction was in the range of 50% to 55%. The study is not technically sufficient to allow evaluation of LV diastolic function. - Aortic valve: Uncertain number of leaflets, cannot rule out AV bicuspid valve. There was severe regurgitation. The AR vena contracta is 0.8 cm. Valve area (VTI): 3.31 cm^2. Valve area (Vmax): 3.11 cm^2. Valve area (Vmean): 2.83 cm^2. - Aorta: Aortic root dimension: 75 mm (ED). - Aortic root: The aortic root was  severely dilated. The dilatation is primarily of the right Sinus of valsalva. The sinotubular junction, proximal ascending aorta, and aortic arch are poorly visualized, cannot evaluate if aneurysmal. - Mitral valve: There was mild regurgitation. - Left atrium: The atrium was severely dilated. - Right ventricle: The cavity size was mildly dilated. - Right atrium: The atrium was moderately dilated. - Atrial septum: No defect or patent foramen ovale was identified. - Pulmonary arteries: Systolic pressure was moderately increased. PA peak pressure: 50 mm Hg (S). - Technically  difficult study.    Radiology/Studies  Dg Chest 2 View  04/09/2014   CLINICAL DATA:  Dry cough and congestion for 3 weeks. Patient was seen in [place] when symptoms first began and then again today. Increased shortness of breath tonight. Former smoker.  EXAM: CHEST  2 VIEW  COMPARISON:  None.  FINDINGS: Cardiac enlargement with mild pulmonary vascular congestion. Small bilateral pleural effusions. Patchy nodular opacities in the lung bases suggest early edema. Findings most consistent with congestive failure although diffuse interstitial pneumonia could also have this appearance. No focal consolidation. No pneumothorax. Mediastinal contours appear intact.  IMPRESSION: Cardiac enlargement with mild pulmonary vascular congestion and probable nodular interstitial edema. Small bilateral pleural effusions.   Electronically Signed   By: Burman NievesWilliam  Stevens M.D.   On: 04/09/2014 23:18    ASSESSMENT AND PLAN  1. Worsening dyspnea 2/2 #2  - continue low dose IV lasix for 1 more day, hold tomorrow in anticipation of TEE.  2. Dilated aortic root/?bicuspid aortic valve/severe AI  - see echo report above  - admitted to IM who will get CT surgery consult, per Dr. Wyline MoodBranch, also need CTA of entire thoracic and abdominal aorta  - TEE scheduled at 8am with Dr. Delton SeeNelson tomorrow. May get CTA today, and L and R heart cath on Thur to allow 48 hrs between contrast dye load. Will discuss with Dr. Jens Somrenshaw   3. Sinus tachycardia   Signed, Azalee CourseMeng, Hao PA-C Pager: [phone]  As above, patient seen and examined.Patient's dyspnea and cough have improved with diuresis. Based on Dr. 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 The morphology of the valve could not be assessed. 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 Plan CTA of thoracic aorta and abdominal aorta today. I will ask cardiothoracic surgery to see as he will require aortic valve replacement/aortic root replacement. He will  require cardiac catheterization preoperatively. If morphology of valve needs to be defined prior to surgery we can arrange transesophageal echocardiogram although not clear to me this would change management. Continue present dose of Lasix and lisinopril. 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 Olga MillersBrian Crenshaw

## 2014-04-12 ENCOUNTER — Encounter (HOSPITAL_COMMUNITY): Payer: Self-pay | Admitting: Interventional Cardiology

## 2014-04-12 ENCOUNTER — Other Ambulatory Visit: Payer: Self-pay | Admitting: *Deleted

## 2014-04-12 ENCOUNTER — Encounter (HOSPITAL_COMMUNITY)
Admission: EM | Disposition: A | Discharge status: Home / Self Care | Payer: Self-pay | Source: Home / Self Care | Attending: Surgery

## 2014-04-12 ENCOUNTER — Inpatient Hospital Stay (HOSPITAL_COMMUNITY): Payer: BLUE CROSS/BLUE SHIELD

## 2014-04-12 DIAGNOSIS — I350 Nonrheumatic aortic (valve) stenosis: Secondary | ICD-10-CM

## 2014-04-12 DIAGNOSIS — I712 Thoracic aortic aneurysm, without rupture, unspecified: Secondary | ICD-10-CM

## 2014-04-12 DIAGNOSIS — I351 Nonrheumatic aortic (valve) insufficiency: Secondary | ICD-10-CM

## 2014-04-12 DIAGNOSIS — I77810 Thoracic aortic ectasia: Secondary | ICD-10-CM

## 2014-04-12 HISTORY — PX: LEFT AND RIGHT HEART CATHETERIZATION WITH CORONARY ANGIOGRAM: SHX5449

## 2014-04-12 LAB — CBC
HCT: 44.3 % (ref 39.0–52.0)
Hemoglobin: 15.6 g/dL (ref 13.0–17.0)
MCH: 33.3 pg (ref 26.0–34.0)
MCHC: 35.2 g/dL (ref 30.0–36.0)
MCV: 94.7 fL (ref 78.0–100.0)
PLATELETS: 213 10*3/uL (ref 150–400)
RBC: 4.68 MIL/uL (ref 4.22–5.81)
RDW: 13.1 % (ref 11.5–15.5)
WBC: 7.5 10*3/uL (ref 4.0–10.5)

## 2014-04-12 LAB — PULMONARY FUNCTION TEST
DL/VA % PRED: 102 %
DL/VA: 4.56 ml/min/mmHg/L
DLCO COR % PRED: 77 %
DLCO UNC: 22.41 ml/min/mmHg
DLCO cor: 21.82 ml/min/mmHg
DLCO unc % pred: 79 %
FEF 25-75 POST: 3.36 L/s
FEF 25-75 Pre: 2.62 L/sec
FEF2575-%Change-Post: 27 %
FEF2575-%PRED-PRE: 87 %
FEF2575-%Pred-Post: 111 %
FEV1-%Change-Post: 9 %
FEV1-%Pred-Post: 82 %
FEV1-%Pred-Pre: 75 %
FEV1-POST: 2.84 L
FEV1-Pre: 2.59 L
FEV1FVC-%CHANGE-POST: 3 %
FEV1FVC-%Pred-Pre: 106 %
FEV6-%CHANGE-POST: 4 %
FEV6-%PRED-PRE: 73 %
FEV6-%Pred-Post: 76 %
FEV6-POST: 3.29 L
FEV6-PRE: 3.15 L
FEV6FVC-%Change-Post: 0 %
FEV6FVC-%PRED-POST: 104 %
FEV6FVC-%Pred-Pre: 104 %
FVC-%Change-Post: 5 %
FVC-%Pred-Post: 74 %
FVC-%Pred-Pre: 71 %
FVC-POST: 3.34 L
FVC-Pre: 3.17 L
PRE FEV6/FVC RATIO: 100 %
Post FEV1/FVC ratio: 85 %
Post FEV6/FVC ratio: 100 %
Pre FEV1/FVC ratio: 82 %
RV % pred: 88 %
RV: 1.72 L
TLC % pred: 79 %
TLC: 5.09 L

## 2014-04-12 LAB — POCT I-STAT 3, ART BLOOD GAS (G3+)
Acid-base deficit: 6 mmol/L — ABNORMAL HIGH (ref 0.0–2.0)
Bicarbonate: 19.5 mEq/L — ABNORMAL LOW (ref 20.0–24.0)
O2 Saturation: 92 %
PCO2 ART: 36.7 mmHg (ref 35.0–45.0)
PH ART: 7.334 — AB (ref 7.350–7.450)
TCO2: 21 mmol/L (ref 0–100)
pO2, Arterial: 68 mmHg — ABNORMAL LOW (ref 80.0–100.0)

## 2014-04-12 LAB — BASIC METABOLIC PANEL
ANION GAP: 8 (ref 5–15)
BUN: 19 mg/dL (ref 6–23)
CO2: 22 mmol/L (ref 19–32)
CREATININE: 1 mg/dL (ref 0.50–1.35)
Calcium: 9 mg/dL (ref 8.4–10.5)
Chloride: 107 mEq/L (ref 96–112)
GFR calc non Af Amer: 84 mL/min — ABNORMAL LOW (ref >90)
Glucose, Bld: 91 mg/dL (ref 70–99)
Potassium: 4.2 mmol/L (ref 3.5–5.1)
Sodium: 137 mmol/L (ref 135–145)

## 2014-04-12 LAB — APTT: APTT: 33 s (ref 24–37)

## 2014-04-12 LAB — ABO/RH: ABO/RH(D): A POS

## 2014-04-12 LAB — CREATININE, SERUM
Creatinine, Ser: 1.01 mg/dL (ref 0.50–1.35)
GFR calc non Af Amer: 83 mL/min — ABNORMAL LOW (ref >90)

## 2014-04-12 LAB — POCT I-STAT 3, VENOUS BLOOD GAS (G3P V)
Acid-base deficit: 4 mmol/L — ABNORMAL HIGH (ref 0.0–2.0)
Bicarbonate: 21.3 mEq/L (ref 20.0–24.0)
O2 Saturation: 70 %
PO2 VEN: 38 mmHg (ref 30.0–45.0)
TCO2: 22 mmol/L (ref 0–100)
pCO2, Ven: 37.8 mmHg — ABNORMAL LOW (ref 45.0–50.0)
pH, Ven: 7.36 — ABNORMAL HIGH (ref 7.250–7.300)

## 2014-04-12 LAB — TYPE AND SCREEN
ABO/RH(D): A POS
Antibody Screen: NEGATIVE

## 2014-04-12 LAB — PROTIME-INR
INR: 1.14 (ref 0.00–1.49)
Prothrombin Time: 14.8 seconds (ref 11.6–15.2)

## 2014-04-12 SURGERY — ECHOCARDIOGRAM, TRANSESOPHAGEAL
Anesthesia: Moderate Sedation

## 2014-04-12 SURGERY — LEFT AND RIGHT HEART CATHETERIZATION WITH CORONARY ANGIOGRAM
Anesthesia: LOCAL

## 2014-04-12 MED ORDER — TEMAZEPAM 7.5 MG PO CAPS: 15.0000 mg | ORAL_CAPSULE | Freq: Once | ORAL | Status: AC | PRN

## 2014-04-12 MED ORDER — SODIUM CHLORIDE 0.9 % IJ SOLN: 3.0000 mL | INTRAMUSCULAR | Status: DC | PRN

## 2014-04-12 MED ORDER — ACETAMINOPHEN 325 MG PO TABS: 650.0000 mg | ORAL_TABLET | ORAL | Status: DC | PRN

## 2014-04-12 MED ORDER — METOPROLOL TARTRATE 12.5 MG HALF TABLET
12.5000 mg | ORAL_TABLET | Freq: Once | ORAL | Status: AC
  Administered 2014-04-13: 12.5 mg via ORAL
  Filled 2014-04-12: qty 1

## 2014-04-12 MED ORDER — SODIUM CHLORIDE 0.9 % IJ SOLN
3.0000 mL | Freq: Two times a day (BID) | INTRAMUSCULAR | Status: DC
  Administered 2014-04-12: 3 mL via INTRAVENOUS

## 2014-04-12 MED ORDER — BISACODYL 5 MG PO TBEC
5.0000 mg | DELAYED_RELEASE_TABLET | Freq: Once | ORAL | Status: AC
  Administered 2014-04-12: 5 mg via ORAL
  Filled 2014-04-12: qty 1

## 2014-04-12 MED ORDER — ENOXAPARIN SODIUM 40 MG/0.4ML ~~LOC~~ SOLN
40.0000 mg | SUBCUTANEOUS | Status: DC
  Filled 2014-04-12 (×2): qty 0.4

## 2014-04-12 MED ORDER — ONDANSETRON HCL 4 MG/2ML IJ SOLN: 4.0000 mg | Freq: Four times a day (QID) | INTRAMUSCULAR | Status: DC | PRN

## 2014-04-12 MED ORDER — ALBUTEROL SULFATE (2.5 MG/3ML) 0.083% IN NEBU
2.5000 mg | INHALATION_SOLUTION | Freq: Once | RESPIRATORY_TRACT | Status: AC
  Administered 2014-04-12: 2.5 mg via RESPIRATORY_TRACT

## 2014-04-12 MED ORDER — NITROGLYCERIN IN D5W 200-5 MCG/ML-% IV SOLN
2.0000 ug/min | INTRAVENOUS | Status: DC
  Filled 2014-04-12: qty 250

## 2014-04-12 MED ORDER — DEXMEDETOMIDINE HCL IN NACL 400 MCG/100ML IV SOLN
0.1000 ug/kg/h | INTRAVENOUS | Status: DC
Start: 2014-04-13 — End: 2014-04-13
  Filled 2014-04-12: qty 100

## 2014-04-12 MED ORDER — OXYCODONE-ACETAMINOPHEN 5-325 MG PO TABS: 1.0000 | ORAL_TABLET | ORAL | Status: DC | PRN

## 2014-04-12 MED ORDER — SODIUM CHLORIDE 0.9 % IV SOLN
INTRAVENOUS | Status: DC
  Administered 2014-04-13: 0.3 [IU]/h via INTRAVENOUS
  Filled 2014-04-12: qty 2.5

## 2014-04-12 MED ORDER — SODIUM CHLORIDE 0.9 % IV SOLN
INTRAVENOUS | Status: AC
  Administered 2014-04-12: 13:00:00 via INTRAVENOUS

## 2014-04-12 MED ORDER — ALPRAZOLAM 0.25 MG PO TABS: 0.2500 mg | ORAL_TABLET | ORAL | Status: DC | PRN

## 2014-04-12 MED ORDER — DOPAMINE-DEXTROSE 3.2-5 MG/ML-% IV SOLN
0.0000 ug/kg/min | INTRAVENOUS | Status: DC
Start: 2014-04-13 — End: 2014-04-13
  Filled 2014-04-12: qty 250

## 2014-04-12 MED ORDER — EPINEPHRINE HCL 1 MG/ML IJ SOLN
0.0000 ug/min | INTRAVENOUS | Status: DC
  Filled 2014-04-12: qty 4

## 2014-04-12 MED ORDER — CHLORHEXIDINE GLUCONATE CLOTH 2 % EX PADS: 6.0000 | MEDICATED_PAD | Freq: Once | CUTANEOUS | Status: AC

## 2014-04-12 MED ORDER — CHLORHEXIDINE GLUCONATE CLOTH 2 % EX PADS
6.0000 | MEDICATED_PAD | Freq: Once | CUTANEOUS | Status: AC
  Administered 2014-04-12: 6 via TOPICAL

## 2014-04-12 MED ORDER — DIAZEPAM 5 MG PO TABS
5.0000 mg | ORAL_TABLET | Freq: Once | ORAL | Status: AC
  Administered 2014-04-13: 5 mg via ORAL
  Filled 2014-04-12: qty 1

## 2014-04-12 MED ORDER — SODIUM CHLORIDE 0.9 % IV SOLN
INTRAVENOUS | Status: DC
  Filled 2014-04-12: qty 40

## 2014-04-12 MED ORDER — DEXTROSE 5 % IV SOLN
1.5000 g | INTRAVENOUS | Status: AC
  Administered 2014-04-13: 1.5 g via INTRAVENOUS
  Administered 2014-04-13: .75 g via INTRAVENOUS
  Filled 2014-04-12: qty 1.5

## 2014-04-12 MED ORDER — POTASSIUM CHLORIDE 2 MEQ/ML IV SOLN
80.0000 meq | INTRAVENOUS | Status: DC
Start: 2014-04-13 — End: 2014-04-13
  Filled 2014-04-12: qty 40

## 2014-04-12 MED ORDER — PLASMA-LYTE 148 IV SOLN
INTRAVENOUS | Status: DC
  Filled 2014-04-12: qty 2.5

## 2014-04-12 MED ORDER — VANCOMYCIN HCL 10 G IV SOLR
1500.0000 mg | INTRAVENOUS | Status: AC
  Administered 2014-04-13: 1500 mg via INTRAVENOUS
  Filled 2014-04-12: qty 1500

## 2014-04-12 MED ORDER — SODIUM CHLORIDE 0.9 % IV SOLN: INTRAVENOUS | Status: DC

## 2014-04-12 MED ORDER — SODIUM CHLORIDE 0.9 % IV SOLN
250.0000 mL | INTRAVENOUS | Status: DC | PRN
Start: 2014-04-12 — End: 2014-04-12

## 2014-04-12 MED ORDER — DEXTROSE 5 % IV SOLN
750.0000 mg | INTRAVENOUS | Status: DC
  Filled 2014-04-12: qty 750

## 2014-04-12 MED ORDER — HEPARIN SODIUM (PORCINE) 1000 UNIT/ML IJ SOLN
INTRAMUSCULAR | Status: DC
  Filled 2014-04-12: qty 30

## 2014-04-12 MED ORDER — MAGNESIUM SULFATE 50 % IJ SOLN
40.0000 meq | INTRAMUSCULAR | Status: DC
  Filled 2014-04-12: qty 10

## 2014-04-12 MED ORDER — ASPIRIN 81 MG PO CHEW
81.0000 mg | CHEWABLE_TABLET | ORAL | Status: DC
  Filled 2014-04-12: qty 1

## 2014-04-12 MED ORDER — PHENYLEPHRINE HCL 10 MG/ML IJ SOLN
30.0000 ug/min | INTRAVENOUS | Status: DC
  Filled 2014-04-12: qty 2

## 2014-04-12 NOTE — Progress Notes (Addendum)
    Subjective:  Denies CP or dyspnea; cough improved   Objective:  Filed Vitals:   04/11/14 2000 04/11/14 2047 04/11/14 2216 04/12/14 0357  BP:  133/56 119/59 128/50  Pulse: 109 105 103 93  Temp:  98 F (36.7 C) 98.2 F (36.8 C) 98.1 F (36.7 C)  TempSrc:  Oral Oral Oral  Resp: 25 21 18 18  Height:      Weight:    197 lb 6.4 oz (89.54 kg)  SpO2: 96% 95% 98% 93%    Intake/Output from previous day:  Intake/Output Summary (Last 24 hours) at 04/12/14 0747 Last data filed at 04/12/14 0435  Gross per 24 hour  Intake   1033 ml  Output   2000 ml  Net   -967 ml    Physical Exam: Physical exam: Well-developed well-nourished in no acute distress.  Skin is warm and dry.  HEENT is normal.  Neck is supple.  Chest is clear to auscultation with normal expansion. 1/6 systolic and 3/6 diastolic murmur Cardiovascular exam is regular rate and rhythm.  Abdominal exam nontender or distended. No masses palpated. Extremities show no edema. neuro grossly intact    Lab Results: Basic Metabolic Panel:  Recent Labs  04/09/14 2255 04/12/14 0337  NA 138 137  K 4.3 4.2  CL 110 107  CO2 22 22  GLUCOSE 99 91  BUN 14 19  CREATININE 0.97 1.00  CALCIUM 9.1 9.0   CBC:  Recent Labs  04/09/14 2255  WBC 8.6  NEUTROABS 5.7  HGB 14.4  HCT 42.5  MCV 96.4  PLT 210   Cardiac Enzymes:  Recent Labs  04/10/14 0104 04/10/14 0641 04/10/14 1227  TROPONINI <0.03 <0.03 0.03     Assessment/Plan:  1 thoracic aortic aneurysm-chest CT confirms results of echocardiogram. 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 This predominantly involves the right sinus of Valsalva. He also has severe aortic insufficiency. Plan to proceed with right and left cardiac catheterization (patient is noted to have dilated pulmonary artery on CT suggesting pulmonary hypertension). The risks and benefits were discussed including myocardial infarction, stroke and death. The patient agrees to proceed.  I will consult cardiothoracic surgery as patient will require aortic root/aortic valve replacement. I have canceled the patient's transesophageal echocardiogram as I do not believe his valve will be able to be spared given the severity of aortic insufficiency. 2 severe aortic insufficiency-as above patient will require aortic root/aortic valve replacement. His symptoms have improved. Continue ACE inhibitor and diuretic. 3 acute diastolic congestive heart failure-clinically the patient is improved. Continue present dose of diuretics.  Brian Crenshaw 04/12/2014, 7:47 AM    

## 2014-04-12 NOTE — Progress Notes (Signed)
To whom it may concern:   Vincent [name] has been hospitalized from 04/09/14, and will likely remain hospitalized at least for another week.   Sincerely,    Crista Curborinna Yuridiana Formanek, MD [facility]

## 2014-04-12 NOTE — Interval H&P Note (Signed)
Cath Lab Visit (complete for each Cath Lab visit)  Clinical Evaluation Leading to the Procedure:   ACS: No.  Non-ACS:    Anginal Classification: CCS III  Anti-ischemic medical therapy: Minimal Therapy (1 class of medications)  Non-Invasive Test Results: No non-invasive testing performed  Prior CABG: No previous CABG      History and Physical Interval Note:  04/12/2014 10:57 AM  Vincent [name] R Bryner  has presented today for surgery, with the diagnosis of CP  The various methods of treatment have been discussed with the patient and family. After consideration of risks, benefits and other options for treatment, the patient has consented to  Procedure(s): LEFT AND RIGHT HEART CATHETERIZATION WITH CORONARY ANGIOGRAM (N/A) as a surgical intervention .  The patient's history has been reviewed, patient examined, no change in status, stable for surgery.  I have reviewed the patient's chart and labs.  Questions were answered to the patient's satisfaction.     Lesleigh NoeSMITH III,HENRY W

## 2014-04-12 NOTE — CV Procedure (Signed)
     Left and Right Heart Catheterization with Coronary Angiography  Report  Casimer BilisDanny R Offer  54 y.o.  male June 21, 1960  Procedure Date: 04/12/2014 Referring Physician: Olga MillersBrian Crenshaw, M.D. Primary Cardiologist: Antoine PocheJonathan F. Branch, M.D.  INDICATIONS: Aortic aneurysm  PROCEDURE: 1. Left heart catheterization; 2. Right heart catheterization; 3. Coronary angiography; 4. Ascending aortic angiography  CONSENT:  The risks, benefits, and details of the procedure were explained in detail to the patient. Risks including death, stroke, heart attack, kidney injury, allergy, limb ischemia, bleeding and radiation injury were discussed.  The patient verbalized understanding and wanted to proceed.  Informed written consent was obtained.  PROCEDURE TECHNIQUE:  After Xylocaine anesthesia a 5 French Slender sheath was placed in the right radial artery with an angiocath and the modified Seldinger technique. A 5 French brachial sheath was placed in the right antecubital vein using a double glove technique, in exchange for an 18-gauge Angiocath using the Seldinger technique. Right heart catheterization was performed using 5 French balloon tipped catheter Coronary angiography was done using a 5 F JR4 and JL4 diagnostic catheter.  Left ventriculography was done using the angled pigtail for hemodynamic recording and left ventricular function was assessed by regurgitation into the LV from the aorta. Ascending aortic angiography was performed at 24 cc/s for total of 50 cc. After review of the digital images case was terminated..   The arterial sheath was removed from the right radial and hemostasis achieved with a wrist band. The antecubital sheath was removed and manual compression applied for hemostasis.   CONTRAST:  Total of 135 cc.  COMPLICATIONS:  None   HEMODYNAMICS:  Aortic pressure 109/59 mmHg; LV pressure 105/10 mmHg; LVEDP 23 mmHg; RA 8 mmHg; RV 46/11 mmHg; PA 50/23 mmHg; PCWP(mean) 23 mmHg; Cardiac Output  6.24 L/m; AV gradient none  ANGIOGRAPHIC DATA:   The left main coronary artery is normal.  The left anterior descending artery is transapical and widely patent. There are mid and distal luminal irregularities and there is some systolic compression in the mid LAD suggesting possible intramyocardial course. 2 large diagonals arise from the proximal to mid LAD.  The left circumflex artery is large and widely patent. The circumflex is dominant..  The right coronary artery is nondominant. No obstruction is noted..  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 There is accompanying 3+ aortic regurgitation. There also appears to be mild enlargement of the ascending aorta.  LEFT VENTRICULOGRAM: Determined by regurgitation from aorta during aortography) in the 20 RAO projection and revealed of normal to mildly dilated LV cavity size with ejection fraction of 45-50%.   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 Significant aortic regurgitation (3+) by aortography 3. Widely patent coronary arteries 4. Low normal to mildly depressed left ventricular systolic function in the 45 to 50% range.   RECOMMENDATION:  TCTS consultation.

## 2014-04-12 NOTE — Consult Note (Signed)
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 Kirk Ruths  CORRIN HINGLE is an 54 y.o. male.  HPI:   The patient has no prior medical history and began having a cough about 2 weeks ago with some mild shortness of breath. He was treated with an antibiotic at [facility] in [place]. His cough persisted and he developed a little swelling in his ankles so he presented to the [facility] again and had a CXR showing some pulmonary congestion. CT of the chest there apparently showed some patchy left upper and right lower lobe ground glass densities and pleural effusions consistent with pulmonary edema. He was sent to the [facility] ED but could not wait so he went to the [facility] and was admitted.  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 It is unclear if there is a bicuspid AV. 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 The aorta returned to normal size above the STJ. Cath today shows normal coronaries.  History reviewed. No pertinent past medical history.  History reviewed. No pertinent past surgical history.  Family History  Problem Relation Age of Onset  . Diabetes Father   . Diabetes Sister     Social History:  reports that he has quit smoking. His smoking use included Cigarettes. He has a 2.5 pack-year smoking history. He does not have any smokeless tobacco history on file. He reports that he does not drink alcohol or use illicit drugs.  Allergies: No Known Allergies  Medications:  I have reviewed the patient's current medications. Prior to Admission:  Prescriptions prior to admission  Medication Sig Dispense Refill Last Dose  . cefTRIAXone (ROCEPHIN) 250 MG injection Inject 250 mg  into the muscle once. FOR IM use in LARGE MUSCLE MASS   04/09/2014 at Unknown time  . dextromethorphan-guaiFENesin (MUCINEX DM) 30-600 MG per 12 hr tablet Take 1 tablet by mouth 2 (two) times daily as needed for cough.    Past Month at Unknown time  . guaiFENesin-codeine (ROBITUSSIN AC) 100-10 MG/5ML syrup Take 5 mLs by mouth 3 (three) times daily as needed for cough.   04/09/2014 at Unknown time  . levofloxacin (LEVAQUIN) 500 MG tablet Take 500 mg by mouth daily.   has not started   Scheduled: . [START ON 04/13/2014] enoxaparin (LOVENOX) injection  40 mg Subcutaneous Q24H  . furosemide  20 mg Intravenous Daily  . Influenza vac split quadrivalent PF  0.5 mL Intramuscular Tomorrow-1000  . lisinopril  2.5 mg Oral Daily   Continuous: . sodium chloride     ALP:[id number], chlorpheniramine-HYDROcodone, ondansetron (ZOFRAN) IV, oxyCODONE-acetaminophen Anti-infectives    Start     Dose/Rate Route Frequency Ordered Stop   04/10/14 1015  levofloxacin (LEVAQUIN) tablet 750 mg  Status:  Discontinued     750 mg Oral Daily 04/10/14 1002 04/11/14 2119      Results for orders placed or performed during the hospital encounter of 04/09/14 (from the past 48 hour(s))  MRSA PCR Screening     Status: None   Collection Time: 04/10/14  9:41 PM  Result Value Ref Range  MRSA by PCR NEGATIVE NEGATIVE    Comment:        The GeneXpert MRSA Assay (FDA approved for NASAL specimens only), is one component of a comprehensive MRSA colonization surveillance program. It is not intended to diagnose MRSA infection nor to guide or monitor treatment for MRSA infections.   Basic metabolic panel     Status: Abnormal   Collection Time: 04/12/14  3:37 AM  Result Value Ref Range   Sodium 137 135 - 145 mmol/L    Comment: Please note change in reference range.   Potassium 4.2 3.5 - 5.1 mmol/L    Comment: Please note change in reference range.   Chloride 107 96 - 112 mEq/L   CO2 22 19 - 32 mmol/L   Glucose, Bld 91 70  - 99 mg/dL   BUN 19 6 - 23 mg/dL   Creatinine, Ser 1.00 0.50 - 1.35 mg/dL   Calcium 9.0 8.4 - 10.5 mg/dL   GFR calc non Af Amer 84 (L) >90 mL/min   GFR calc Af Amer >90 >90 mL/min    Comment: (NOTE) The eGFR has been calculated using the CKD EPI equation. This calculation has not been validated in all clinical situations. eGFR's persistently <90 mL/min signify possible Chronic Kidney Disease.    Anion gap 8 5 - 15    Ct Angio Abdomen W/cm &/or Wo Contrast  04/11/2014   CLINICAL DATA:  54 year old male with severe aortic stenosis with regurgitation and dilatation of the aortic root. Preoperative planning evaluation.  EXAM: CT ANGIOGRAPHY CHEST, ABDOMEN  TECHNIQUE: Multidetector CT imaging through the chest and abdomen was performed using the standard protocol during bolus administration of intravenous contrast. Multiplanar reconstructed images and MIPs were obtained and reviewed to evaluate the vascular anatomy.  CONTRAST:  143m OMNIPAQUE IOHEXOL 350 MG/ML SOLN  COMPARISON:  Chest x-ray 04/09/2014  FINDINGS: CTA CHEST FINDINGS  VASCULAR  Heart/Vascular: No evidence of acute intramural hematoma on the non contrasted images. Conventional 3 vessel aortic arch anatomy. Massive aneurysmal dilatation of the aortic root with a maximal transverse diameter of 8.2 cm in the AP dimension at the level of the coronaries sinuses. Measure transversely across the left and non Coronary sinuses, the aortic root measures 5.5 cm in diameter. 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 The tubular portion of the ascending aorta remains within normal limits for size at 3.4 cm. There is no effacement of the sino-tubular junction. The arch and descending thoracic aorta remain within normal limits in caliber. No evidence of dissection.  The pulmonary trunk is enlarged at 3.9 cm. This is nonspecific but can be seen in the setting of pulmonary arterial hypertension. Cardiomegaly with right  heart enlargement. No pericardial effusion.  Review of the MIP images confirms the above findings.  NON VASCULAR  Mediastinum: Unremarkable CT appearance of the thyroid gland. No suspicious mediastinal or hilar adenopathy. No soft tissue mediastinal mass. The thoracic esophagus is unremarkable.  Lungs/Pleura: Moderate layering right pleural effusion with associated right basilar atelectasis. Respiratory motion limits evaluation for small pulmonary nodules. Dependent atelectasis is present within the lower lobes. Patchy ground-glass attenuation foci in the left upper and left lower lobes may represent areas of early alveolar edema, or an infectious/inflammatory process. Low-grade adenocarcinoma is a less likely possibility. Diffuse mild bronchial wall thickening.  Bones/Soft Tissues: No acute fracture or aggressive appearing lytic or blastic osseous lesion.  CTA ABDOMEN FINDINGS  VASCULAR  Aorta: Normal caliber aorta with  No significant atherosclerotic vascular disease, dissection or other acute abnormality.  Celiac: Widely patent. Conventional hepatic arterial anatomy. No visceral vascular artery.  SMA: Widely patent and unremarkable.  Renals: Single right and 2 left renal arteries. No evidence of fibromuscular dysplasia. No significant atherosclerotic plaque or stenosis.  IMA: Widely patent and unremarkable.  Inflow: Ectatic left common iliac artery measures up to 1.7 cm. No stenosis or dissection.  Veins: No focal venous abnormality.  Review of the MIP images confirms the above findings.  NON-VASCULAR  Abdomen: Unremarkable CT appearance of the stomach, duodenum, spleen, adrenal glands and pancreas. Normal hepatic contour and morphology. No discrete hepatic lesion. Gallbladder is unremarkable. No intra or extrahepatic biliary ductal dilatation. Unremarkable appearance of the bilateral kidneys. No focal solid lesion, hydronephrosis or nephrolithiasis. No evidence of obstruction or focal bowel wall thickening.  Normal appendix in the right lower quadrant. The terminal ileum is unremarkable.  Bones/Soft Tissues: No acute fracture or aggressive appearing lytic or blastic osseous lesion.  IMPRESSION: VASCULAR  1. 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 Recommend cardiothoracic surgical consultation. 2. There is no associated effacement of the sino-tubular junction and no aneurysmal dilatation of the tubular portion of the ascending aorta. Similarly, the arch, descending and abdominal aorta are all normal in caliber. 3. Mild ectasia of the left common iliac artery to 1.7 cm. 4. Cardiomegaly with right heart enlargement. 5. Enlargement of the pulmonary trunk suggesting underlying pulmonary arterial hypertension. NON VASCULAR  1. Patchy regions of ground-glass attenuation opacity in the upper and lower lobes are favored to reflect areas of early alveolar edema. Foci of and underlying infectious/inflammatory process are also possible in the appropriate clinical setting. Underlying low-grade adenocarcinoma is considered less likely. Recommend imaging followup to resolution. 2. Moderate layering right pleural effusion. 3. No focal abnormality in the abdomen. Signed,  Criselda Peaches, MD  [facility]  [facility]   Electronically Signed   By: Jacqulynn Cadet M.D.   On: 04/11/2014 17:26   Ct Angio Chest Aortic Dissect W &/or W/o  04/11/2014   CLINICAL DATA:  54 year old male with severe aortic stenosis with regurgitation and dilatation of the aortic root. Preoperative planning evaluation.  EXAM: CT ANGIOGRAPHY CHEST, ABDOMEN  TECHNIQUE: Multidetector CT imaging through the chest and abdomen was performed using the standard protocol during bolus administration of intravenous contrast. Multiplanar  reconstructed images and MIPs were obtained and reviewed to evaluate the vascular anatomy.  CONTRAST:  177m OMNIPAQUE IOHEXOL 350 MG/ML SOLN  COMPARISON:  Chest x-ray 04/09/2014  FINDINGS: CTA CHEST FINDINGS  VASCULAR  Heart/Vascular: No evidence of acute intramural hematoma on the non contrasted images. Conventional 3 vessel aortic arch anatomy. Massive aneurysmal dilatation of the aortic root with a maximal transverse diameter of 8.2 cm in the AP dimension at the level of the coronaries sinuses. Measure transversely across the left and non Coronary sinuses, the aortic root measures 5.5 cm in diameter. 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 The tubular portion of the ascending aorta remains within normal limits for size at 3.4 cm. There is no effacement of the sino-tubular junction. The arch and descending thoracic aorta remain within normal limits in caliber. No evidence of dissection.  The pulmonary trunk is enlarged at 3.9 cm. This is nonspecific but  can be seen in the setting of pulmonary arterial hypertension. Cardiomegaly with right heart enlargement. No pericardial effusion.  Review of the MIP images confirms the above findings.  NON VASCULAR  Mediastinum: Unremarkable CT appearance of the thyroid gland. No suspicious mediastinal or hilar adenopathy. No soft tissue mediastinal mass. The thoracic esophagus is unremarkable.  Lungs/Pleura: Moderate layering right pleural effusion with associated right basilar atelectasis. Respiratory motion limits evaluation for small pulmonary nodules. Dependent atelectasis is present within the lower lobes. Patchy ground-glass attenuation foci in the left upper and left lower lobes may represent areas of early alveolar edema, or an infectious/inflammatory process. Low-grade adenocarcinoma is a less likely possibility. Diffuse mild bronchial wall thickening.  Bones/Soft Tissues: No acute fracture or aggressive appearing lytic or  blastic osseous lesion.  CTA ABDOMEN FINDINGS  VASCULAR  Aorta: Normal caliber aorta with No significant atherosclerotic vascular disease, dissection or other acute abnormality.  Celiac: Widely patent. Conventional hepatic arterial anatomy. No visceral vascular artery.  SMA: Widely patent and unremarkable.  Renals: Single right and 2 left renal arteries. No evidence of fibromuscular dysplasia. No significant atherosclerotic plaque or stenosis.  IMA: Widely patent and unremarkable.  Inflow: Ectatic left common iliac artery measures up to 1.7 cm. No stenosis or dissection.  Veins: No focal venous abnormality.  Review of the MIP images confirms the above findings.  NON-VASCULAR  Abdomen: Unremarkable CT appearance of the stomach, duodenum, spleen, adrenal glands and pancreas. Normal hepatic contour and morphology. No discrete hepatic lesion. Gallbladder is unremarkable. No intra or extrahepatic biliary ductal dilatation. Unremarkable appearance of the bilateral kidneys. No focal solid lesion, hydronephrosis or nephrolithiasis. No evidence of obstruction or focal bowel wall thickening. Normal appendix in the right lower quadrant. The terminal ileum is unremarkable.  Bones/Soft Tissues: No acute fracture or aggressive appearing lytic or blastic osseous lesion.  IMPRESSION: VASCULAR  1. 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 Recommend cardiothoracic surgical consultation. 2. There is no associated effacement of the sino-tubular junction and no aneurysmal dilatation of the tubular portion of the ascending aorta. Similarly, the arch, descending and abdominal aorta are all normal in caliber. 3. Mild ectasia of the left common iliac artery to 1.7 cm. 4. Cardiomegaly with right heart enlargement. 5. Enlargement of the pulmonary trunk  suggesting underlying pulmonary arterial hypertension. NON VASCULAR  1. Patchy regions of ground-glass attenuation opacity in the upper and lower lobes are favored to reflect areas of early alveolar edema. Foci of and underlying infectious/inflammatory process are also possible in the appropriate clinical setting. Underlying low-grade adenocarcinoma is considered less likely. Recommend imaging followup to resolution. 2. Moderate layering right pleural effusion. 3. No focal abnormality in the abdomen. Signed,  Criselda Peaches, MD  [facility]  [facility]   Electronically Signed   By: Jacqulynn Cadet M.D.   On: 04/11/2014 17:26    Review of Systems  Constitutional: Negative for fever, chills, weight loss, malaise/fatigue and diaphoresis.  HENT: Negative.        Saw his dentist within the past year. No active problems.  Eyes: Negative.   Respiratory: Positive for cough and shortness of breath. Negative for hemoptysis and sputum production.   Cardiovascular: Positive for leg swelling. Negative for chest pain, palpitations and orthopnea.  Gastrointestinal: Negative.   Genitourinary: Negative.   Musculoskeletal: Negative.  Skin: Negative.   Neurological: Negative.   Endo/Heme/Allergies: Negative.   Psychiatric/Behavioral: Negative.    Blood pressure 128/50, pulse 100, temperature 98.1 F (36.7 C), temperature source Oral, resp. rate 18, height $RemoveBe'5\' 7"'aiylNmedr$  (1.702 m), weight 89.54 kg (197 lb 6.4 oz), SpO2 93 %. Physical Exam  Constitutional: He is oriented to person, place, and time. He appears well-developed and well-nourished. No distress.  HENT:  Head: Normocephalic and atraumatic.  Mouth/Throat: Oropharynx is clear and moist.  Eyes: EOM are normal. Pupils are equal, round, and reactive to light.  Neck: Normal range of motion. Neck supple. No JVD present. No thyromegaly present.  Cardiovascular: Normal rate, regular rhythm and intact distal  pulses.   Murmur heard. 3/6 diastolic AI murmur   Respiratory: Effort normal and breath sounds normal. No respiratory distress. He has no rales.  GI: Soft. Bowel sounds are normal. He exhibits no distension and no mass. There is no tenderness.  Musculoskeletal: Normal range of motion. He exhibits no edema or tenderness.  Lymphadenopathy:    He has no cervical adenopathy.  Neurological: He is alert and oriented to person, place, and time. He has normal strength. No cranial nerve deficit or sensory deficit.  Skin: Skin is warm and dry.  Psychiatric: He has a normal mood and affect.           [place], [place] [address]              [phone]  ------------------------------------------------------------------- Transthoracic Echocardiography  Patient:  Kerby, Borner MR #:    [id number] Study Date: 04/10/2014 Gender:   M Age:    44 Height:   170.2 cm Weight:   93.9 kg BSA:    2.14 m^2 Pt. Status: Room:    A336  ADMITTING  Jani Gravel [id number] OVFIEPPI   RJJ, OACZY [id number] Yevonne Aline [id number] ATTENDING  Noemi Chapel D SONOGRAPHER Leavy Cella PERFORMING  Chmg, Forestine Na  cc:  ------------------------------------------------------------------- LV EF: 50% -  55%  ------------------------------------------------------------------- Indications:   Murmur 785.2.  ------------------------------------------------------------------- History:  PMH: Former Smoker, Pneumonia, Cardiomegaly, Plueral Effusion.  ------------------------------------------------------------------- Study Conclusions  - Left ventricle: The cavity size was normal. Wall thickness was normal. Systolic function was normal. The estimated ejection fraction was in the range of 50% to 55%. The study is not technically sufficient to allow  evaluation of LV diastolic function. - Aortic valve: Uncertain number of leaflets, cannot rule out AV bicuspid valve. There was severe regurgitation. The AR vena contracta is 0.8 cm. Valve area (VTI): 3.31 cm^2. Valve area (Vmax): 3.11 cm^2. Valve area (Vmean): 2.83 cm^2. - Aorta: Aortic root dimension: 75 mm (ED). - Aortic root: The aortic root was severely dilated. The dilatation is primarily of the right Sinus of valsalva. The sinotubular junction, proximal ascending aorta, and aortic arch are poorly visualized, cannot evaluate if aneurysmal. - Mitral valve: There was mild regurgitation. - Left atrium: The atrium was severely dilated. - Right ventricle: The cavity size was mildly dilated. - Right atrium: The atrium was moderately dilated. - Atrial septum: No defect or patent foramen ovale was identified. - Pulmonary arteries: Systolic pressure was moderately increased. PA peak pressure: 50 mm Hg (S). - Technically difficult study.  Transthoracic echocardiography. M-mode, complete 2D, spectral Doppler, and color Doppler. Birthdate: Patient birthdate: 26-Apr-1960. Age: Patient is 54 yr  old. Sex: Gender: male. BMI: 32.4 kg/m^2. Blood pressure:   133/63 Patient status: Inpatient. Study date: Study date: 04/10/2014. Study time: 03:01 PM. Location: Bedside.  -------------------------------------------------------------------  ------------------------------------------------------------------- Left ventricle: The cavity size was normal. Wall thickness was normal. Systolic function was normal. The estimated ejection fraction was in the range of 50% to 55%. Images were inadequate for LV wall motion assessment. The study is not technically sufficient to allow evaluation of LV diastolic function.  ------------------------------------------------------------------- Aortic valve: Uncertain number of leaflets, cannot rule out AV bicuspid valve. Normal  thickness leaflets. Doppler:  There was no stenosis.  There was severe regurgitation. The AR vena contracta is 0.8 cm.  VTI ratio of LVOT to aortic valve: 0.67. Valve area (VTI): 3.31 cm^2. Indexed valve area (VTI): 1.55 cm^2/m^2. Peak velocity ratio of LVOT to aortic valve: 0.63. Valve area (Vmax): 3.11 cm^2. Indexed valve area (Vmax): 1.45 cm^2/m^2. Mean velocity ratio of LVOT to aortic valve: 0.58. Valve area (Vmean): 2.83 cm^2. Indexed valve area (Vmean): 1.32 cm^2/m^2. Mean gradient (S): 9 mm Hg. Peak gradient (S): 19 mm Hg.  ------------------------------------------------------------------- Aorta: Aortic root: The aortic root was severely dilated. The dilatation is primarily of the right Sinus of valsalva. The sinotubular junction, proximal ascending aorta, and aortic arch are poorly visualized, cannot evaluate if aneurysmal.  ------------------------------------------------------------------- Mitral valve:  Normal thickness leaflets . Doppler:  There was no evidence for stenosis.  There was mild regurgitation.  ------------------------------------------------------------------- Left atrium: The atrium was severely dilated.  ------------------------------------------------------------------- Atrial septum: No defect or patent foramen ovale was identified.  ------------------------------------------------------------------- Right ventricle: The cavity size was mildly dilated. Systolic function was normal. RV TAPSE is 2.4 cm.  ------------------------------------------------------------------- Pulmonic valve:  Not well visualized. Doppler:  There was no evidence for stenosis.  There was no significant regurgitation.  ------------------------------------------------------------------- Tricuspid valve: Not well visualized. Normal thickness leaflets. Doppler:  There was no evidence for stenosis.  There was  mild regurgitation.  ------------------------------------------------------------------- Pulmonary artery:  Systolic pressure was moderately increased.  ------------------------------------------------------------------- Right atrium: The atrium was moderately dilated.  ------------------------------------------------------------------- Pericardium: There is a trivial pericardial effusion.  ------------------------------------------------------------------- Systemic veins: IVC is dilated with normal respiratory response, estimated RA pressure is 8 mmHg. Inferior vena cava: The vessel was normal in size. The respirophasic diameter changes were in the normal range (>= 50%), consistent with normal central venous pressure.  ------------------------------------------------------------------- Measurements  Left ventricle              Value      Reference LV ID, ED, PLAX chordal     (H)   62   mm    43 - 52 LV ID, ES, PLAX chordal     (H)   50.1  mm    23 - 38 LV fx shortening, PLAX chordal  (L)   19   %    >=29 LV PW thickness, ED           10   mm    --------- IVS/LV PW ratio, ED           1        <=1.3  Ventricular septum            Value      Reference IVS thickness, ED            10   mm    ---------  LVOT                   Value  Reference LVOT ID, S                25   mm    --------- LVOT area                4.91  cm^2   --------- LVOT peak velocity, S          137.54 cm/s   --------- LVOT mean velocity, S          78.4  cm/s   --------- LVOT VTI, S               25.8  cm    --------- LVOT peak gradient, S          8   mm Hg  ---------  Aortic valve               Value      Reference Aortic  valve peak velocity, S      217.18 cm/s   --------- Aortic valve mean velocity, S      135.83 cm/s   --------- Aortic valve VTI, S           38.26 cm    --------- Aortic mean gradient, S         9   mm Hg  --------- Aortic peak gradient, S         19   mm Hg  --------- VTI ratio, LVOT/AV            0.67      --------- Aortic valve area, VTI          3.31  cm^2   --------- Aortic valve area/bsa, VTI        1.55  cm^2/m^2 --------- Velocity ratio, peak, LVOT/AV      0.63      --------- Aortic valve area, peak velocity     3.11  cm^2   --------- Aortic valve area/bsa, peak       1.45  cm^2/m^2 --------- velocity Velocity ratio, mean, LVOT/AV      0.58      --------- Aortic valve area, mean velocity     2.83  cm^2   --------- Aortic valve area/bsa, mean       1.32  cm^2/m^2 --------- velocity  Aorta                  Value      Reference Aortic root ID, ED            75   mm    ---------  Left atrium               Value      Reference LA ID, A-P, ES              39   mm    --------- LA volume/bsa, S             49.1  ml/m^2  ---------  Pulmonary arteries            Value      Reference PA pressure, S, DP        (H)   50   mm Hg  <=30  Tricuspid valve             Value      Reference Tricuspid regurg peak velocity      324  cm/s   --------- Tricuspid peak RV-RA gradient      42  mm Hg  ---------  Systemic veins              Value      Reference Estimated CVP              3   mm Hg  ---------  Right ventricle             Value      Reference RV pressure, S, DP        (H)   45   mm Hg   <=30  Legend: (L) and (H) mark values outside specified reference range.  ------------------------------------------------------------------- Prepared and Electronically Authenticated by  Kerry Hough, M.D. 2016-01-05T16:17:30    CLINICAL DATA: 54 year old male with severe aortic stenosis with regurgitation and dilatation of the aortic root. Preoperative planning evaluation.  EXAM: CT ANGIOGRAPHY CHEST, ABDOMEN  TECHNIQUE: Multidetector CT imaging through the chest and abdomen was performed using the standard protocol during bolus administration of intravenous contrast. Multiplanar reconstructed images and MIPs were obtained and reviewed to evaluate the vascular anatomy.  CONTRAST: 165m OMNIPAQUE IOHEXOL 350 MG/ML SOLN  COMPARISON: Chest x-ray 04/09/2014  FINDINGS: CTA CHEST FINDINGS  VASCULAR  Heart/Vascular: No evidence of acute intramural hematoma on the non contrasted images. Conventional 3 vessel aortic arch anatomy. Massive aneurysmal dilatation of the aortic root with a maximal transverse diameter of 8.2 cm in the AP dimension at the level of the coronaries sinuses. Measure transversely across the left and non Coronary sinuses, the aortic root measures 5.5 cm in diameter. 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 The tubular portion of the ascending aorta remains within normal limits for size at 3.4 cm. There is no effacement of the sino-tubular junction. The arch and descending thoracic aorta remain within normal limits in caliber. No evidence of dissection.  The pulmonary trunk is enlarged at 3.9 cm. This is nonspecific but can be seen in the setting of pulmonary arterial hypertension. Cardiomegaly with right heart enlargement. No pericardial effusion.  Review of the MIP images confirms the above findings.  NON VASCULAR  Mediastinum: Unremarkable CT appearance of the thyroid gland.  No suspicious mediastinal or hilar adenopathy. No soft tissue mediastinal mass. The thoracic esophagus is unremarkable.  Lungs/Pleura: Moderate layering right pleural effusion with associated right basilar atelectasis. Respiratory motion limits evaluation for small pulmonary nodules. Dependent atelectasis is present within the lower lobes. Patchy ground-glass attenuation foci in the left upper and left lower lobes may represent areas of early alveolar edema, or an infectious/inflammatory process. Low-grade adenocarcinoma is a less likely possibility. Diffuse mild bronchial wall thickening.  Bones/Soft Tissues: No acute fracture or aggressive appearing lytic or blastic osseous lesion.  CTA ABDOMEN FINDINGS  VASCULAR  Aorta: Normal caliber aorta with No significant atherosclerotic vascular disease, dissection or other acute abnormality.  Celiac: Widely patent. Conventional hepatic arterial anatomy. No visceral vascular artery.  SMA: Widely patent and unremarkable.  Renals: Single right and 2 left renal arteries. No evidence of fibromuscular dysplasia. No significant atherosclerotic plaque or stenosis.  IMA: Widely patent and unremarkable.  Inflow: Ectatic left common iliac artery measures up to 1.7 cm. No stenosis or dissection.  Veins: No focal venous abnormality.  Review of the MIP images confirms the above findings.  NON-VASCULAR  Abdomen: Unremarkable CT appearance of the stomach, duodenum, spleen, adrenal glands and pancreas. Normal hepatic contour and morphology. No discrete hepatic lesion. Gallbladder is unremarkable. No intra or extrahepatic biliary ductal dilatation. Unremarkable appearance of the  bilateral kidneys. No focal solid lesion, hydronephrosis or nephrolithiasis. No evidence of obstruction or focal bowel wall thickening. Normal appendix in the right lower quadrant. The terminal ileum is unremarkable.  Bones/Soft Tissues: No acute  fracture or aggressive appearing lytic or blastic osseous lesion.  IMPRESSION: VASCULAR  1. 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 Recommend cardiothoracic surgical consultation. 2. There is no associated effacement of the sino-tubular junction and no aneurysmal dilatation of the tubular portion of the ascending aorta. Similarly, the arch, descending and abdominal aorta are all normal in caliber. 3. Mild ectasia of the left common iliac artery to 1.7 cm. 4. Cardiomegaly with right heart enlargement. 5. Enlargement of the pulmonary trunk suggesting underlying pulmonary arterial hypertension. NON VASCULAR  1. Patchy regions of ground-glass attenuation opacity in the upper and lower lobes are favored to reflect areas of early alveolar edema. Foci of and underlying infectious/inflammatory process are also possible in the appropriate clinical setting. Underlying low-grade adenocarcinoma is considered less likely. Recommend imaging followup to resolution. 2. Moderate layering right pleural effusion. 3. No focal abnormality in the abdomen. Signed,  Criselda Peaches, MD  [facility]  [facility]   Electronically Signed  By: Jacqulynn Cadet M.D.  On: 04/11/2014 17:26    Left and Right Heart Catheterization with Coronary Angiography Report  SYRE KNERR  54 y.o.  male 1960/05/18  Procedure Date: 04/12/2014 Referring Physician: Kirk Ruths, M.D. Primary Cardiologist: Arnoldo Lenis, M.D.  INDICATIONS: Aortic aneurysm  PROCEDURE: 1. Left heart catheterization; 2. Right heart catheterization; 3. Coronary angiography; 4. Ascending aortic angiography  CONSENT:  The risks, benefits, and details of the procedure were  explained in detail to the patient. Risks including death, stroke, heart attack, kidney injury, allergy, limb ischemia, bleeding and radiation injury were discussed. The patient verbalized understanding and wanted to proceed. Informed written consent was obtained.  PROCEDURE TECHNIQUE: After Xylocaine anesthesia a 5 French Slender sheath was placed in the right radial artery with an angiocath and the modified Seldinger technique. A 5 French brachial sheath was placed in the right antecubital vein using a double glove technique, in exchange for an 18-gauge Angiocath using the Seldinger technique. Right heart catheterization was performed using 5 French balloon tipped catheter Coronary angiography was done using a 5 F JR4 and JL4 diagnostic catheter. Left ventriculography was done using the angled pigtail for hemodynamic recording and left ventricular function was assessed by regurgitation into the LV from the aorta. Ascending aortic angiography was performed at 24 cc/s for total of 50 cc. After review of the digital images case was terminated..   The arterial sheath was removed from the right radial and hemostasis achieved with a wrist band. The antecubital sheath was removed and manual compression applied for hemostasis.  CONTRAST: Total of 135 cc.  COMPLICATIONS: None   HEMODYNAMICS: Aortic pressure 109/59 mmHg; LV pressure 105/10 mmHg; LVEDP 23 mmHg; RA 8 mmHg; RV 46/11 mmHg; PA 50/23 mmHg; PCWP(mean) 23 mmHg; Cardiac Output 6.24 L/m; AV gradient none  ANGIOGRAPHIC DATA: The left main coronary artery is normal.  The left anterior descending artery is transapical and widely patent. There are mid and distal luminal irregularities and there is some systolic compression in the mid LAD suggesting possible intramyocardial course. 2 large diagonals arise from the proximal to mid LAD.  The  left circumflex artery is large and widely patent. The circumflex is dominant..  The right coronary  artery is nondominant. No obstruction is noted..  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 There is accompanying 3+ aortic regurgitation. There also appears to be mild enlargement of the ascending aorta.  LEFT VENTRICULOGRAM: Determined by regurgitation from aorta during aortography) in the 20 RAO projection and revealed of normal to mildly dilated LV cavity size with ejection fraction of 45-50%.   IMPRESSIONS: 1. Right coronary sinus of Valsalva aneurysm 2. Significant aortic regurgitation (3+) by aortography 3. Widely patent coronary arteries 4. Low normal to mildly depressed left ventricular systolic function in the 45 to 50% range.   RECOMMENDATION: TCTS consultation.            Assessment/Plan:  I personally reviewed his CTA, echo and cardiac cath films. 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 This will require Bentall Procedure. I would use a mechanical valve given the patients age. I discussed the pros and cons of mechanical and tissue valves with him including the need for lifelong anticoagulation with coumadin for a mechanical valve and he is in agreement with a mechanical valve. He is familiar with coumadin since his mother takes it. He is a reliable patient and has no contraindication to anticoagulation. I discussed the operative procedure with the patient and family including alternatives, benefits and risks; including but not limited to bleeding, blood transfusion, infection, stroke, myocardial infarction, graft failure, heart block requiring a permanent pacemaker, organ dysfunction, and death.  Gilford Rile understands and agrees to proceed.  We will schedule surgery for tomorrow morning.  Thailyn Khalid K 04/12/2014, 1:08 PM

## 2014-04-12 NOTE — Progress Notes (Signed)
VASCULAR LAB PRELIMINARY  PRELIMINARY  PRELIMINARY  PRELIMINARY  Pre-op Cardiac Surgery  Carotid Findings:  Bilateral:  1-39% ICA stenosis.  Vertebral artery flow is antegrade.      Upper Extremity Right Left  Brachial Pressures triphasic 143 triphasic  Radial Waveforms triphasic triphasic  Ulnar Waveforms triphasic triphasic  Palmar Arch (Allen's Test) * **WNL   Findings:  *Right:  Palmar arch not done due to cardiac cath via right radial artery.  **Left:  Doppler waveforms remain normal with ulnar and radial compressions.    Pleshette Tomasini, RVT 04/12/2014, 4:02 PM

## 2014-04-12 NOTE — Progress Notes (Signed)
Patient not a room. Getting testing. Vital signs labs reviewed. Discussed with family. Note written for family members. Plans noted. Appreciate consultants.  Vincent Curborinna Aryanah Enslow, MD [facility]

## 2014-04-12 NOTE — H&P (View-Only) (Signed)
    Subjective:  Denies CP or dyspnea; cough improved   Objective:  Filed Vitals:   04/11/14 2000 04/11/14 2047 04/11/14 2216 04/12/14 0357  BP:  133/56 119/59 128/50  Pulse: 109 105 103 93  Temp:  98 F (36.7 C) 98.2 F (36.8 C) 98.1 F (36.7 C)  TempSrc:  Oral Oral Oral  Resp: 25 21 18 18   Height:      Weight:    197 lb 6.4 oz (89.54 kg)  SpO2: 96% 95% 98% 93%    Intake/Output from previous day:  Intake/Output Summary (Last 24 hours) at 04/12/14 0747 Last data filed at 04/12/14 0435  Gross per 24 hour  Intake   1033 ml  Output   2000 ml  Net   -967 ml    Physical Exam: Physical exam: Well-developed well-nourished in no acute distress.  Skin is warm and dry.  HEENT is normal.  Neck is supple.  Chest is clear to auscultation with normal expansion. 1/6 systolic and 3/6 diastolic murmur Cardiovascular exam is regular rate and rhythm.  Abdominal exam nontender or distended. No masses palpated. Extremities show no edema. neuro grossly intact    Lab Results: Basic Metabolic Panel:  Recent Labs  82/95/6199/07/20 2255 04/12/14 0337  NA 138 137  K 4.3 4.2  CL 110 107  CO2 22 22  GLUCOSE 99 91  BUN 14 19  CREATININE 0.97 1.00  CALCIUM 9.1 9.0   CBC:  Recent Labs  04/09/14 2255  WBC 8.6  NEUTROABS 5.7  HGB 14.4  HCT 42.5  MCV 96.4  PLT 210   Cardiac Enzymes:  Recent Labs  04/10/14 0104 04/10/14 0641 04/10/14 1227  TROPONINI <0.03 <0.03 0.03     Assessment/Plan:  1 thoracic aortic aneurysm-chest CT confirms results of echocardiogram. 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 This predominantly involves the right sinus of Valsalva. He also has severe aortic insufficiency. Plan to proceed with right and left cardiac catheterization (patient is noted to have dilated pulmonary artery on CT suggesting pulmonary hypertension). The risks and benefits were discussed including myocardial infarction, stroke and death. The patient agrees to proceed.  I will consult cardiothoracic surgery as patient will require aortic root/aortic valve replacement. I have canceled the patient's transesophageal echocardiogram as I do not believe his valve will be able to be spared given the severity of aortic insufficiency. 2 severe aortic insufficiency-as above patient will require aortic root/aortic valve replacement. His symptoms have improved. Continue ACE inhibitor and diuretic. 3 acute diastolic congestive heart failure-clinically the patient is improved. Continue present dose of diuretics.  Vincent MillersBrian [name] 04/12/2014, 7:47 AM

## 2014-04-12 NOTE — Progress Notes (Signed)
CARDIAC REHAB PHASE I   Preop completed with pt and wife. Voiced understanding, receptive. Gave [facility] book and guideline. Gave instructions on watching video. Gave pt IS and instructions. Inspiring 2000 mL.  [id number]  Elissa LovettReeve, Ramzi Brathwaite RooseveltKristan CES, ACSM 04/12/2014 2:24 PM

## 2014-04-13 ENCOUNTER — Inpatient Hospital Stay (HOSPITAL_COMMUNITY): Payer: BLUE CROSS/BLUE SHIELD

## 2014-04-13 ENCOUNTER — Encounter (HOSPITAL_COMMUNITY): Payer: Self-pay | Admitting: Anesthesiology

## 2014-04-13 ENCOUNTER — Inpatient Hospital Stay (HOSPITAL_COMMUNITY): Payer: BLUE CROSS/BLUE SHIELD | Admitting: Certified Registered Nurse Anesthetist

## 2014-04-13 ENCOUNTER — Encounter (HOSPITAL_COMMUNITY)
Admission: EM | Disposition: A | Discharge status: Home / Self Care | Payer: BLUE CROSS/BLUE SHIELD | Source: Home / Self Care | Attending: Surgery

## 2014-04-13 DIAGNOSIS — Z952 Presence of prosthetic heart valve: Secondary | ICD-10-CM

## 2014-04-13 HISTORY — PX: BENTALL PROCEDURE: SHX5058

## 2014-04-13 HISTORY — PX: TEE WITHOUT CARDIOVERSION: SHX5443

## 2014-04-13 LAB — POCT I-STAT, CHEM 8
BUN: 19 mg/dL (ref 6–23)
BUN: 20 mg/dL (ref 6–23)
BUN: 19 mg/dL (ref 6–23)
BUN: 19 mg/dL (ref 6–23)
BUN: 20 mg/dL (ref 6–23)
BUN: 21 mg/dL (ref 6–23)
BUN: 22 mg/dL (ref 6–23)
BUN: 21 mg/dL (ref 6–23)
CALCIUM ION: 1.19 mmol/L (ref 1.12–1.23)
CALCIUM ION: 1.11 mmol/L — AB (ref 1.12–1.23)
CALCIUM ION: 1.14 mmol/L (ref 1.12–1.23)
CALCIUM ION: 1.12 mmol/L (ref 1.12–1.23)
CALCIUM ION: 1.09 mmol/L — AB (ref 1.12–1.23)
CALCIUM ION: 1.29 mmol/L — AB (ref 1.12–1.23)
CHLORIDE: 105 meq/L (ref 96–112)
CHLORIDE: 100 meq/L (ref 96–112)
CHLORIDE: 104 meq/L (ref 96–112)
CHLORIDE: 104 meq/L (ref 96–112)
CHLORIDE: 105 meq/L (ref 96–112)
CREATININE: 0.8 mg/dL (ref 0.50–1.35)
CREATININE: 0.9 mg/dL (ref 0.50–1.35)
Calcium, Ion: 1.19 mmol/L (ref 1.12–1.23)
Calcium, Ion: 1.17 mmol/L (ref 1.12–1.23)
Chloride: 104 mEq/L (ref 96–112)
Chloride: 105 mEq/L (ref 96–112)
Chloride: 107 mEq/L (ref 96–112)
Creatinine, Ser: 0.8 mg/dL (ref 0.50–1.35)
Creatinine, Ser: 0.8 mg/dL (ref 0.50–1.35)
Creatinine, Ser: 0.9 mg/dL (ref 0.50–1.35)
Creatinine, Ser: 0.8 mg/dL (ref 0.50–1.35)
Creatinine, Ser: 0.9 mg/dL (ref 0.50–1.35)
Creatinine, Ser: 0.9 mg/dL (ref 0.50–1.35)
GLUCOSE: 102 mg/dL — AB (ref 70–99)
GLUCOSE: 106 mg/dL — AB (ref 70–99)
GLUCOSE: 141 mg/dL — AB (ref 70–99)
Glucose, Bld: 93 mg/dL (ref 70–99)
Glucose, Bld: 126 mg/dL — ABNORMAL HIGH (ref 70–99)
Glucose, Bld: 143 mg/dL — ABNORMAL HIGH (ref 70–99)
Glucose, Bld: 134 mg/dL — ABNORMAL HIGH (ref 70–99)
Glucose, Bld: 132 mg/dL — ABNORMAL HIGH (ref 70–99)
HEMATOCRIT: 38 % — AB (ref 39.0–52.0)
HEMATOCRIT: 42 % (ref 39.0–52.0)
HEMATOCRIT: 32 % — AB (ref 39.0–52.0)
HEMATOCRIT: 32 % — AB (ref 39.0–52.0)
HEMATOCRIT: 33 % — AB (ref 39.0–52.0)
HEMATOCRIT: 30 % — AB (ref 39.0–52.0)
HEMATOCRIT: 31 % — AB (ref 39.0–52.0)
HEMATOCRIT: 46 % (ref 39.0–52.0)
HEMOGLOBIN: 14.3 g/dL (ref 13.0–17.0)
HEMOGLOBIN: 10.9 g/dL — AB (ref 13.0–17.0)
HEMOGLOBIN: 10.5 g/dL — AB (ref 13.0–17.0)
Hemoglobin: 12.9 g/dL — ABNORMAL LOW (ref 13.0–17.0)
Hemoglobin: 10.9 g/dL — ABNORMAL LOW (ref 13.0–17.0)
Hemoglobin: 11.2 g/dL — ABNORMAL LOW (ref 13.0–17.0)
Hemoglobin: 10.2 g/dL — ABNORMAL LOW (ref 13.0–17.0)
Hemoglobin: 15.6 g/dL (ref 13.0–17.0)
POTASSIUM: 3.7 mmol/L (ref 3.5–5.1)
POTASSIUM: 4.6 mmol/L (ref 3.5–5.1)
Potassium: 4 mmol/L (ref 3.5–5.1)
Potassium: 3.8 mmol/L (ref 3.5–5.1)
Potassium: 4.8 mmol/L (ref 3.5–5.1)
Potassium: 5 mmol/L (ref 3.5–5.1)
Potassium: 4.9 mmol/L (ref 3.5–5.1)
Potassium: 5.9 mmol/L — ABNORMAL HIGH (ref 3.5–5.1)
Sodium: 139 mmol/L (ref 135–145)
Sodium: 141 mmol/L (ref 135–145)
Sodium: 136 mmol/L (ref 135–145)
Sodium: 137 mmol/L (ref 135–145)
Sodium: 137 mmol/L (ref 135–145)
Sodium: 136 mmol/L (ref 135–145)
Sodium: 138 mmol/L (ref 135–145)
Sodium: 139 mmol/L (ref 135–145)
TCO2: 20 mmol/L (ref 0–100)
TCO2: 21 mmol/L (ref 0–100)
TCO2: 22 mmol/L (ref 0–100)
TCO2: 21 mmol/L (ref 0–100)
TCO2: 22 mmol/L (ref 0–100)
TCO2: 21 mmol/L (ref 0–100)
TCO2: 22 mmol/L (ref 0–100)
TCO2: 18 mmol/L (ref 0–100)

## 2014-04-13 LAB — APTT: aPTT: 45 seconds — ABNORMAL HIGH (ref 24–37)

## 2014-04-13 LAB — URINE MICROSCOPIC-ADD ON

## 2014-04-13 LAB — PLATELET COUNT: Platelets: 158 10*3/uL (ref 150–400)

## 2014-04-13 LAB — BASIC METABOLIC PANEL
Anion gap: 8 (ref 5–15)
BUN: 17 mg/dL (ref 6–23)
CO2: 23 mmol/L (ref 19–32)
Calcium: 8.9 mg/dL (ref 8.4–10.5)
Chloride: 107 mEq/L (ref 96–112)
Creatinine, Ser: 0.97 mg/dL (ref 0.50–1.35)
GFR calc Af Amer: >90 mL/min (ref >90)
GFR calc non Af Amer: >90 mL/min (ref >90)
Glucose, Bld: 80 mg/dL (ref 70–99)
Potassium: 3.8 mmol/L (ref 3.5–5.1)
SODIUM: 138 mmol/L (ref 135–145)

## 2014-04-13 LAB — URINALYSIS, ROUTINE W REFLEX MICROSCOPIC
BILIRUBIN URINE: NEGATIVE
Glucose, UA: NEGATIVE mg/dL
KETONES UR: NEGATIVE mg/dL
Leukocytes, UA: NEGATIVE
Nitrite: NEGATIVE
PH: 6 (ref 5.0–8.0)
Protein, ur: NEGATIVE mg/dL
Specific Gravity, Urine: 1.02 (ref 1.005–1.030)
Urobilinogen, UA: 1 mg/dL (ref 0.0–1.0)

## 2014-04-13 LAB — MAGNESIUM: MAGNESIUM: 2.9 mg/dL — AB (ref 1.5–2.5)

## 2014-04-13 LAB — POCT I-STAT 3, ART BLOOD GAS (G3+)
ACID-BASE DEFICIT: 2 mmol/L (ref 0.0–2.0)
Acid-base deficit: 2 mmol/L (ref 0.0–2.0)
Acid-base deficit: 4 mmol/L — ABNORMAL HIGH (ref 0.0–2.0)
Acid-base deficit: 6 mmol/L — ABNORMAL HIGH (ref 0.0–2.0)
Acid-base deficit: 4 mmol/L — ABNORMAL HIGH (ref 0.0–2.0)
BICARBONATE: 19.2 meq/L — AB (ref 20.0–24.0)
BICARBONATE: 20.4 meq/L (ref 20.0–24.0)
Bicarbonate: 24 mEq/L (ref 20.0–24.0)
Bicarbonate: 23.9 mEq/L (ref 20.0–24.0)
Bicarbonate: 21.5 mEq/L (ref 20.0–24.0)
O2 Saturation: 100 %
O2 Saturation: 100 %
O2 Saturation: 84 %
O2 Saturation: 91 %
O2 Saturation: 88 %
PCO2 ART: 43.5 mmHg (ref 35.0–45.0)
PCO2 ART: 41.4 mmHg (ref 35.0–45.0)
PH ART: 7.333 — AB (ref 7.350–7.450)
PH ART: 7.322 — AB (ref 7.350–7.450)
PO2 ART: 371 mmHg — AB (ref 80.0–100.0)
PO2 ART: 51 mmHg — AB (ref 80.0–100.0)
PO2 ART: 61 mmHg — AB (ref 80.0–100.0)
Patient temperature: 36.7
TCO2: 25 mmol/L (ref 0–100)
TCO2: 25 mmol/L (ref 0–100)
TCO2: 23 mmol/L (ref 0–100)
TCO2: 20 mmol/L (ref 0–100)
TCO2: 21 mmol/L (ref 0–100)
pCO2 arterial: 45 mmHg (ref 35.0–45.0)
pCO2 arterial: 36.1 mmHg (ref 35.0–45.0)
pCO2 arterial: 37.6 mmHg (ref 35.0–45.0)
pH, Arterial: 7.351 (ref 7.350–7.450)
pH, Arterial: 7.338 — ABNORMAL LOW (ref 7.350–7.450)
pH, Arterial: 7.346 — ABNORMAL LOW (ref 7.350–7.450)
pO2, Arterial: 347 mmHg — ABNORMAL HIGH (ref 80.0–100.0)
pO2, Arterial: 68 mmHg — ABNORMAL LOW (ref 80.0–100.0)

## 2014-04-13 LAB — CBC
HEMATOCRIT: 44 % (ref 39.0–52.0)
HEMATOCRIT: 40.9 % (ref 39.0–52.0)
HEMATOCRIT: 44.2 % (ref 39.0–52.0)
HEMOGLOBIN: 15 g/dL (ref 13.0–17.0)
HEMOGLOBIN: 14.2 g/dL (ref 13.0–17.0)
Hemoglobin: 15.5 g/dL (ref 13.0–17.0)
MCH: 31.8 pg (ref 26.0–34.0)
MCH: 32.1 pg (ref 26.0–34.0)
MCH: 32.1 pg (ref 26.0–34.0)
MCHC: 34.1 g/dL (ref 30.0–36.0)
MCHC: 34.7 g/dL (ref 30.0–36.0)
MCHC: 35.1 g/dL (ref 30.0–36.0)
MCV: 93.4 fL (ref 78.0–100.0)
MCV: 92.5 fL (ref 78.0–100.0)
MCV: 91.5 fL (ref 78.0–100.0)
PLATELETS: 222 10*3/uL (ref 150–400)
PLATELETS: 128 10*3/uL — AB (ref 150–400)
Platelets: 147 10*3/uL — ABNORMAL LOW (ref 150–400)
RBC: 4.71 MIL/uL (ref 4.22–5.81)
RBC: 4.42 MIL/uL (ref 4.22–5.81)
RBC: 4.83 MIL/uL (ref 4.22–5.81)
RDW: 13.2 % (ref 11.5–15.5)
RDW: 13 % (ref 11.5–15.5)
RDW: 12.9 % (ref 11.5–15.5)
WBC: 7.8 10*3/uL (ref 4.0–10.5)
WBC: 22 10*3/uL — AB (ref 4.0–10.5)
WBC: 19.9 10*3/uL — ABNORMAL HIGH (ref 4.0–10.5)

## 2014-04-13 LAB — HEMOGLOBIN AND HEMATOCRIT, BLOOD
HCT: 32.7 % — ABNORMAL LOW (ref 39.0–52.0)
Hemoglobin: 11.2 g/dL — ABNORMAL LOW (ref 13.0–17.0)

## 2014-04-13 LAB — POCT I-STAT 4, (NA,K, GLUC, HGB,HCT)
Glucose, Bld: 93 mg/dL (ref 70–99)
HCT: 40 % (ref 39.0–52.0)
HEMOGLOBIN: 13.6 g/dL (ref 13.0–17.0)
Potassium: 4.4 mmol/L (ref 3.5–5.1)
Sodium: 140 mmol/L (ref 135–145)

## 2014-04-13 LAB — PROTIME-INR
INR: 1.52 — ABNORMAL HIGH (ref 0.00–1.49)
Prothrombin Time: 18.5 seconds — ABNORMAL HIGH (ref 11.6–15.2)

## 2014-04-13 LAB — GLUCOSE, CAPILLARY
GLUCOSE-CAPILLARY: 104 mg/dL — AB (ref 70–99)
GLUCOSE-CAPILLARY: 126 mg/dL — AB (ref 70–99)
Glucose-Capillary: 94 mg/dL (ref 70–99)
Glucose-Capillary: 116 mg/dL — ABNORMAL HIGH (ref 70–99)

## 2014-04-13 LAB — CREATININE, SERUM
Creatinine, Ser: 1.05 mg/dL (ref 0.50–1.35)
GFR calc Af Amer: >90 mL/min (ref >90)
GFR calc non Af Amer: 79 mL/min — ABNORMAL LOW (ref >90)

## 2014-04-13 LAB — HEMOGLOBIN A1C
HEMOGLOBIN A1C: 5.2 % (ref <5.7)
Mean Plasma Glucose: 103 mg/dL (ref <117)

## 2014-04-13 SURGERY — BENTALL PROCEDURE
Anesthesia: General | Site: Chest

## 2014-04-13 MED ORDER — CALCIUM CHLORIDE 10 % IV SOLN
INTRAVENOUS | Status: DC | PRN
  Administered 2014-04-13 (×2): .2 g via INTRAVENOUS

## 2014-04-13 MED ORDER — ONDANSETRON HCL 4 MG/2ML IJ SOLN
4.0000 mg | Freq: Four times a day (QID) | INTRAMUSCULAR | Status: DC | PRN
  Administered 2014-04-14 – 2014-04-15 (×2): 4 mg via INTRAVENOUS
  Filled 2014-04-13 (×2): qty 2

## 2014-04-13 MED ORDER — SODIUM CHLORIDE 0.9 % IV SOLN
10.0000 g | INTRAVENOUS | Status: DC | PRN
  Administered 2014-04-13: 5 g/h via INTRAVENOUS

## 2014-04-13 MED ORDER — ROCURONIUM BROMIDE 50 MG/5ML IV SOLN
INTRAVENOUS | Status: AC
  Filled 2014-04-13: qty 2

## 2014-04-13 MED ORDER — ACETAMINOPHEN 160 MG/5ML PO SOLN
650.0000 mg | Freq: Once | ORAL | Status: AC
  Administered 2014-04-13: 650 mg

## 2014-04-13 MED ORDER — LACTATED RINGERS IV SOLN
INTRAVENOUS | Status: DC | PRN
  Administered 2014-04-13: 08:00:00 via INTRAVENOUS

## 2014-04-13 MED ORDER — METOPROLOL TARTRATE 12.5 MG HALF TABLET
12.5000 mg | ORAL_TABLET | Freq: Two times a day (BID) | ORAL | Status: DC
  Filled 2014-04-13 (×3): qty 1

## 2014-04-13 MED ORDER — SODIUM CHLORIDE 0.9 % IV SOLN
200.0000 ug | INTRAVENOUS | Status: DC | PRN
  Administered 2014-04-13: 0.2 ug/kg/h via INTRAVENOUS

## 2014-04-13 MED ORDER — CHLORHEXIDINE GLUCONATE 0.12 % MT SOLN: 15.0000 mL | Freq: Two times a day (BID) | OROMUCOSAL | Status: DC

## 2014-04-13 MED ORDER — FENTANYL CITRATE 0.05 MG/ML IJ SOLN
INTRAMUSCULAR | Status: AC
  Filled 2014-04-13: qty 5

## 2014-04-13 MED ORDER — MIDAZOLAM HCL 2 MG/2ML IJ SOLN
2.0000 mg | INTRAMUSCULAR | Status: DC | PRN
Start: 2014-04-13 — End: 2014-04-13
  Administered 2014-04-13: 2 mg via INTRAVENOUS
  Filled 2014-04-13: qty 2

## 2014-04-13 MED ORDER — METOPROLOL TARTRATE 1 MG/ML IV SOLN: 2.5000 mg | INTRAVENOUS | Status: DC | PRN

## 2014-04-13 MED ORDER — LIDOCAINE HCL (CARDIAC) 20 MG/ML IV SOLN
INTRAVENOUS | Status: DC | PRN
  Administered 2014-04-13: 60 mg via INTRAVENOUS

## 2014-04-13 MED ORDER — SODIUM CHLORIDE 0.9 % IV SOLN
250.0000 [IU] | INTRAVENOUS | Status: DC | PRN
  Administered 2014-04-13: 1.3 [IU]/h via INTRAVENOUS

## 2014-04-13 MED ORDER — ACETAMINOPHEN 160 MG/5ML PO SOLN: 1000.0000 mg | Freq: Four times a day (QID) | ORAL | Status: DC

## 2014-04-13 MED ORDER — ACETAMINOPHEN 500 MG PO TABS
1000.0000 mg | ORAL_TABLET | Freq: Four times a day (QID) | ORAL | Status: DC
  Administered 2014-04-14 – 2014-04-15 (×5): 1000 mg via ORAL
  Filled 2014-04-13 (×9): qty 2

## 2014-04-13 MED ORDER — THROMBIN 20000 UNITS EX SOLR
CUTANEOUS | Status: AC
  Filled 2014-04-13: qty 20000

## 2014-04-13 MED ORDER — THROMBIN 20000 UNITS EX SOLR
OROMUCOSAL | Status: DC | PRN
  Administered 2014-04-13: 4 mL via TOPICAL

## 2014-04-13 MED ORDER — NITROGLYCERIN IN D5W 200-5 MCG/ML-% IV SOLN
0.0000 ug/min | INTRAVENOUS | Status: DC
  Administered 2014-04-13: 10 ug/min via INTRAVENOUS

## 2014-04-13 MED ORDER — THROMBIN 20000 UNITS EX SOLR
CUTANEOUS | Status: DC | PRN
  Administered 2014-04-13: 20000 [IU] via TOPICAL

## 2014-04-13 MED ORDER — MIDAZOLAM HCL 5 MG/5ML IJ SOLN
INTRAMUSCULAR | Status: DC | PRN
  Administered 2014-04-13: 3 mg via INTRAVENOUS
  Administered 2014-04-13 (×2): 2 mg via INTRAVENOUS
  Administered 2014-04-13: 3 mg via INTRAVENOUS

## 2014-04-13 MED ORDER — BISACODYL 5 MG PO TBEC
10.0000 mg | DELAYED_RELEASE_TABLET | Freq: Every day | ORAL | Status: DC
  Administered 2014-04-14 – 2014-04-15 (×2): 10 mg via ORAL
  Filled 2014-04-13 (×2): qty 2

## 2014-04-13 MED ORDER — TRAMADOL HCL 50 MG PO TABS: 50.0000 mg | ORAL_TABLET | ORAL | Status: DC | PRN

## 2014-04-13 MED ORDER — INSULIN ASPART 100 UNIT/ML ~~LOC~~ SOLN
0.0000 [IU] | SUBCUTANEOUS | Status: DC
  Administered 2014-04-13 – 2014-04-14 (×3): 2 [IU] via SUBCUTANEOUS

## 2014-04-13 MED ORDER — SODIUM CHLORIDE 0.9 % IV SOLN
INTRAVENOUS | Status: DC
  Filled 2014-04-13: qty 2.5

## 2014-04-13 MED ORDER — DIPHENHYDRAMINE HCL 50 MG/ML IJ SOLN
INTRAMUSCULAR | Status: AC
  Filled 2014-04-13: qty 1

## 2014-04-13 MED ORDER — MIDAZOLAM HCL 2 MG/2ML IJ SOLN
INTRAMUSCULAR | Status: AC
  Filled 2014-04-13: qty 2

## 2014-04-13 MED ORDER — VANCOMYCIN HCL IN DEXTROSE 1-5 GM/200ML-% IV SOLN
1000.0000 mg | Freq: Once | INTRAVENOUS | Status: AC
Start: 2014-04-13 — End: 2014-04-13
  Administered 2014-04-13: 1000 mg via INTRAVENOUS
  Filled 2014-04-13: qty 200

## 2014-04-13 MED ORDER — SODIUM CHLORIDE 0.9 % IV SOLN
INTRAVENOUS | Status: DC
  Administered 2014-04-13 (×2): via INTRAVENOUS
  Administered 2014-04-15: 10 mL/h via INTRAVENOUS

## 2014-04-13 MED ORDER — HEPARIN SODIUM (PORCINE) 1000 UNIT/ML IJ SOLN
INTRAMUSCULAR | Status: DC | PRN
  Administered 2014-04-13: 30 mL via INTRAVENOUS

## 2014-04-13 MED ORDER — MAGNESIUM SULFATE 4 GM/100ML IV SOLN
4.0000 g | Freq: Once | INTRAVENOUS | Status: AC
  Administered 2014-04-13: 4 g via INTRAVENOUS
  Filled 2014-04-13: qty 100

## 2014-04-13 MED ORDER — DOPAMINE-DEXTROSE 3.2-5 MG/ML-% IV SOLN
INTRAVENOUS | Status: DC | PRN
  Administered 2014-04-13: 3 ug/kg/min via INTRAVENOUS

## 2014-04-13 MED ORDER — FAMOTIDINE IN NACL 20-0.9 MG/50ML-% IV SOLN
20.0000 mg | Freq: Two times a day (BID) | INTRAVENOUS | Status: DC
  Administered 2014-04-13: 20 mg via INTRAVENOUS

## 2014-04-13 MED ORDER — SODIUM CHLORIDE 0.45 % IV SOLN
INTRAVENOUS | Status: DC
  Administered 2014-04-13: 16:00:00 via INTRAVENOUS

## 2014-04-13 MED ORDER — ASPIRIN EC 325 MG PO TBEC
325.0000 mg | DELAYED_RELEASE_TABLET | Freq: Every day | ORAL | Status: DC
  Filled 2014-04-13: qty 1

## 2014-04-13 MED ORDER — SODIUM CHLORIDE 0.9 % IJ SOLN
3.0000 mL | Freq: Two times a day (BID) | INTRAMUSCULAR | Status: DC
  Administered 2014-04-14 – 2014-04-15 (×2): 3 mL via INTRAVENOUS

## 2014-04-13 MED ORDER — BISACODYL 10 MG RE SUPP: 10.0000 mg | Freq: Every day | RECTAL | Status: DC

## 2014-04-13 MED ORDER — LACTATED RINGERS IV SOLN
INTRAVENOUS | Status: DC | PRN
  Administered 2014-04-13 (×2): via INTRAVENOUS

## 2014-04-13 MED ORDER — PROPOFOL 10 MG/ML IV BOLUS
INTRAVENOUS | Status: DC | PRN
  Administered 2014-04-13: 60 mg via INTRAVENOUS

## 2014-04-13 MED ORDER — ROCURONIUM BROMIDE 50 MG/5ML IV SOLN
INTRAVENOUS | Status: AC
  Filled 2014-04-13: qty 1

## 2014-04-13 MED ORDER — HEPARIN SODIUM (PORCINE) 1000 UNIT/ML IJ SOLN
INTRAMUSCULAR | Status: AC
  Filled 2014-04-13: qty 1

## 2014-04-13 MED ORDER — MORPHINE SULFATE 2 MG/ML IJ SOLN
2.0000 mg | INTRAMUSCULAR | Status: DC | PRN
  Administered 2014-04-14: 4 mg via INTRAVENOUS
  Filled 2014-04-13: qty 1
  Filled 2014-04-13 (×2): qty 2

## 2014-04-13 MED ORDER — LIDOCAINE HCL (CARDIAC) 20 MG/ML IV SOLN
INTRAVENOUS | Status: AC
  Filled 2014-04-13: qty 5

## 2014-04-13 MED ORDER — SODIUM CHLORIDE 0.9 % IV SOLN
250.0000 mL | INTRAVENOUS | Status: DC
  Administered 2014-04-14: 250 mL via INTRAVENOUS

## 2014-04-13 MED ORDER — DEXTROSE 5 % IV SOLN
1.5000 g | Freq: Two times a day (BID) | INTRAVENOUS | Status: AC
  Administered 2014-04-14 – 2014-04-15 (×4): 1.5 g via INTRAVENOUS
  Filled 2014-04-13 (×4): qty 1.5

## 2014-04-13 MED ORDER — NITROGLYCERIN IN D5W 200-5 MCG/ML-% IV SOLN
INTRAVENOUS | Status: DC | PRN
  Administered 2014-04-13: 5 ug/min via INTRAVENOUS

## 2014-04-13 MED ORDER — PROTAMINE SULFATE 10 MG/ML IV SOLN
INTRAVENOUS | Status: AC
  Filled 2014-04-13: qty 10

## 2014-04-13 MED ORDER — PHENYLEPHRINE HCL 10 MG/ML IJ SOLN
10.0000 mg | INTRAVENOUS | Status: DC | PRN
  Administered 2014-04-13: 40 ug/min via INTRAVENOUS

## 2014-04-13 MED ORDER — ASPIRIN 81 MG PO CHEW: 324.0000 mg | CHEWABLE_TABLET | Freq: Every day | ORAL | Status: DC

## 2014-04-13 MED ORDER — MORPHINE SULFATE 2 MG/ML IJ SOLN
1.0000 mg | INTRAMUSCULAR | Status: AC | PRN
  Administered 2014-04-13 (×2): 4 mg via INTRAVENOUS
  Filled 2014-04-13: qty 2

## 2014-04-13 MED ORDER — SODIUM CHLORIDE 0.9 % IJ SOLN
INTRAMUSCULAR | Status: AC
  Filled 2014-04-13: qty 10

## 2014-04-13 MED ORDER — FENTANYL CITRATE 0.05 MG/ML IJ SOLN
INTRAMUSCULAR | Status: DC | PRN
  Administered 2014-04-13: 250 ug via INTRAVENOUS
  Administered 2014-04-13: 600 ug via INTRAVENOUS
  Administered 2014-04-13 (×2): 100 ug via INTRAVENOUS
  Administered 2014-04-13: 50 ug via INTRAVENOUS
  Administered 2014-04-13: 150 ug via INTRAVENOUS

## 2014-04-13 MED ORDER — DIPHENHYDRAMINE HCL 50 MG/ML IJ SOLN
INTRAMUSCULAR | Status: DC | PRN
  Administered 2014-04-13: 50 mg via INTRAVENOUS

## 2014-04-13 MED ORDER — SODIUM CHLORIDE 0.9 % IV SOLN: 200.0000 ug | INTRAVENOUS | Status: DC | PRN

## 2014-04-13 MED ORDER — PROTAMINE SULFATE 10 MG/ML IV SOLN
INTRAVENOUS | Status: DC | PRN
  Administered 2014-04-13: 300 mg via INTRAVENOUS

## 2014-04-13 MED ORDER — ALBUMIN HUMAN 5 % IV SOLN: 250.0000 mL | INTRAVENOUS | Status: AC | PRN

## 2014-04-13 MED ORDER — ACETAMINOPHEN 650 MG RE SUPP: 650.0000 mg | Freq: Once | RECTAL | Status: AC

## 2014-04-13 MED ORDER — POTASSIUM CHLORIDE 10 MEQ/50ML IV SOLN: 10.0000 meq | INTRAVENOUS | Status: AC

## 2014-04-13 MED ORDER — HEMOSTATIC AGENTS (NO CHARGE) OPTIME
TOPICAL | Status: DC | PRN
  Administered 2014-04-13: 1 via TOPICAL

## 2014-04-13 MED ORDER — FENTANYL CITRATE 0.05 MG/ML IJ SOLN
INTRAMUSCULAR | Status: AC
  Filled 2014-04-13: qty 2

## 2014-04-13 MED ORDER — LACTATED RINGERS IV SOLN: 500.0000 mL | Freq: Once | INTRAVENOUS | Status: AC | PRN

## 2014-04-13 MED ORDER — SODIUM CHLORIDE 0.9 % IV SOLN
INTRAVENOUS | Status: DC
  Filled 2014-04-13: qty 40

## 2014-04-13 MED ORDER — DOCUSATE SODIUM 100 MG PO CAPS
200.0000 mg | ORAL_CAPSULE | Freq: Every day | ORAL | Status: DC
  Administered 2014-04-14 – 2014-04-15 (×2): 200 mg via ORAL
  Filled 2014-04-13 (×2): qty 2

## 2014-04-13 MED ORDER — SODIUM BICARBONATE 8.4 % IV SOLN
50.0000 meq | Freq: Once | INTRAVENOUS | Status: AC
  Administered 2014-04-13: 50 meq via INTRAVENOUS
  Filled 2014-04-13: qty 50

## 2014-04-13 MED ORDER — CETYLPYRIDINIUM CHLORIDE 0.05 % MT LIQD: 7.0000 mL | Freq: Four times a day (QID) | OROMUCOSAL | Status: DC

## 2014-04-13 MED ORDER — ROCURONIUM BROMIDE 100 MG/10ML IV SOLN
INTRAVENOUS | Status: DC | PRN
  Administered 2014-04-13: 50 mg via INTRAVENOUS
  Administered 2014-04-13: 100 mg via INTRAVENOUS
  Administered 2014-04-13 (×2): 50 mg via INTRAVENOUS

## 2014-04-13 MED ORDER — DEXMEDETOMIDINE HCL IN NACL 200 MCG/50ML IV SOLN
INTRAVENOUS | Status: AC
  Filled 2014-04-13: qty 50

## 2014-04-13 MED ORDER — LACTATED RINGERS IV SOLN
INTRAVENOUS | Status: DC
  Administered 2014-04-13 (×2): via INTRAVENOUS

## 2014-04-13 MED ORDER — DEXMEDETOMIDINE HCL IN NACL 200 MCG/50ML IV SOLN
0.0000 ug/kg/h | INTRAVENOUS | Status: DC
  Administered 2014-04-13: 0.7 ug/kg/h via INTRAVENOUS

## 2014-04-13 MED ORDER — INSULIN REGULAR BOLUS VIA INFUSION
0.0000 [IU] | Freq: Three times a day (TID) | INTRAVENOUS | Status: DC
  Filled 2014-04-13: qty 10

## 2014-04-13 MED ORDER — OXYCODONE HCL 5 MG PO TABS
5.0000 mg | ORAL_TABLET | ORAL | Status: DC | PRN
  Administered 2014-04-14 – 2014-04-15 (×3): 5 mg via ORAL
  Filled 2014-04-13 (×3): qty 1

## 2014-04-13 MED ORDER — SODIUM CHLORIDE 0.9 % IJ SOLN
3.0000 mL | INTRAMUSCULAR | Status: DC | PRN
Start: 2014-04-14 — End: 2014-04-15

## 2014-04-13 MED ORDER — PANTOPRAZOLE SODIUM 40 MG PO TBEC
40.0000 mg | DELAYED_RELEASE_TABLET | Freq: Every day | ORAL | Status: DC
  Administered 2014-04-15: 40 mg via ORAL
  Filled 2014-04-13: qty 1

## 2014-04-13 MED ORDER — LACTATED RINGERS IV SOLN
INTRAVENOUS | Status: DC | PRN
  Administered 2014-04-13 (×2): via INTRAVENOUS

## 2014-04-13 MED ORDER — DEXTROSE 5 % IV SOLN
0.0000 ug/min | INTRAVENOUS | Status: DC
  Administered 2014-04-14: 40 ug/min via INTRAVENOUS
  Filled 2014-04-13 (×2): qty 2

## 2014-04-13 MED ORDER — METOPROLOL TARTRATE 25 MG/10 ML ORAL SUSPENSION
12.5000 mg | Freq: Two times a day (BID) | ORAL | Status: DC
  Filled 2014-04-13 (×3): qty 5

## 2014-04-13 MED ORDER — MIDAZOLAM HCL 10 MG/2ML IJ SOLN
INTRAMUSCULAR | Status: AC
  Filled 2014-04-13: qty 2

## 2014-04-13 SURGICAL SUPPLY — 78 items
ADAPTER CARDIO PERF ANTE/RETRO (ADAPTER) ×4 IMPLANT
APPLICATOR TIP COSEAL (VASCULAR PRODUCTS) ×8 IMPLANT
ATTRACTOMAT 16X20 MAGNETIC DRP (DRAPES) IMPLANT
BAG DECANTER FOR FLEXI CONT (MISCELLANEOUS) IMPLANT
BLADE STERNUM SYSTEM 6 (BLADE) ×4 IMPLANT
BLADE SURG 15 STRL LF DISP TIS (BLADE) ×2 IMPLANT
BLADE SURG 15 STRL SS (BLADE) ×2
BOOT SUTURE AID YELLOW STND (SUTURE) ×4 IMPLANT
CANISTER SUCTION 2500CC (MISCELLANEOUS) ×4 IMPLANT
CANNULA GUNDRY RCSP 15FR (MISCELLANEOUS) ×4 IMPLANT
CATH ROBINSON RED A/P 18FR (CATHETERS) IMPLANT
CATH THORACIC 36FR (CATHETERS) ×4 IMPLANT
CATH THORACIC 36FR RT ANG (CATHETERS) ×4 IMPLANT
CAUTERY HIGH TEMP VAS (MISCELLANEOUS) IMPLANT
CAUTERY SURG HI TEMP FINE TIP (MISCELLANEOUS) ×4 IMPLANT
CONT SPEC STER OR (MISCELLANEOUS) ×8 IMPLANT
COVER SURGICAL LIGHT HANDLE (MISCELLANEOUS) ×8 IMPLANT
CRADLE DONUT ADULT HEAD (MISCELLANEOUS) ×4 IMPLANT
DRAPE SLUSH/WARMER DISC (DRAPES) ×4 IMPLANT
DRSG COVADERM 4X14 (GAUZE/BANDAGES/DRESSINGS) ×4 IMPLANT
ELECT CAUTERY BLADE 6.4 (BLADE) ×4 IMPLANT
ELECT REM PT RETURN 9FT ADLT (ELECTROSURGICAL) ×8
ELECTRODE REM PT RTRN 9FT ADLT (ELECTROSURGICAL) ×4 IMPLANT
GAUZE SPONGE 4X4 12PLY STRL (GAUZE/BANDAGES/DRESSINGS) ×4 IMPLANT
GLOVE BIO SURGEON STRL SZ 6 (GLOVE) IMPLANT
GLOVE BIO SURGEON STRL SZ 6.5 (GLOVE) IMPLANT
GLOVE BIO SURGEON STRL SZ7 (GLOVE) IMPLANT
GLOVE BIO SURGEON STRL SZ7.5 (GLOVE) IMPLANT
GLOVE BIO SURGEONS STRL SZ 6.5 (GLOVE)
GLOVE EUDERMIC 7 POWDERFREE (GLOVE) ×8 IMPLANT
GOWN STRL REUS W/ TWL LRG LVL3 (GOWN DISPOSABLE) ×16 IMPLANT
GOWN STRL REUS W/ TWL XL LVL3 (GOWN DISPOSABLE) ×2 IMPLANT
GOWN STRL REUS W/TWL LRG LVL3 (GOWN DISPOSABLE) ×16
GOWN STRL REUS W/TWL XL LVL3 (GOWN DISPOSABLE) ×2
HEART VENT LT CURVED (MISCELLANEOUS) ×4 IMPLANT
HEMOSTAT POWDER SURGIFOAM 1G (HEMOSTASIS) ×12 IMPLANT
HEMOSTAT SURGICEL 2X14 (HEMOSTASIS) ×4 IMPLANT
INSERT FOGARTY XLG (MISCELLANEOUS) ×4 IMPLANT
KIT BASIN OR (CUSTOM PROCEDURE TRAY) ×4 IMPLANT
KIT CATH CPB BARTLE (MISCELLANEOUS) ×4 IMPLANT
KIT ROOM TURNOVER OR (KITS) ×4 IMPLANT
KIT SUCTION CATH 14FR (SUCTIONS) ×16 IMPLANT
LINE VENT (MISCELLANEOUS) ×4 IMPLANT
NS IRRIG 1000ML POUR BTL (IV SOLUTION) ×24 IMPLANT
PACK OPEN HEART (CUSTOM PROCEDURE TRAY) ×4 IMPLANT
PAD ARMBOARD 7.5X6 YLW CONV (MISCELLANEOUS) ×8 IMPLANT
SEALANT SURG COSEAL 8ML (VASCULAR PRODUCTS) ×4 IMPLANT
SET CARDIOPLEGIA MPS 5001102 (MISCELLANEOUS) ×4 IMPLANT
SPONGE GAUZE 4X4 12PLY STER LF (GAUZE/BANDAGES/DRESSINGS) ×4 IMPLANT
SUT BONE WAX W31G (SUTURE) ×4 IMPLANT
SUT ETHIBON 2 0 V 52N 30 (SUTURE) ×8 IMPLANT
SUT ETHIBON EXCEL 2-0 V-5 (SUTURE) IMPLANT
SUT ETHIBOND V-5 VALVE (SUTURE) IMPLANT
SUT PROLENE 3 0 SH 1 (SUTURE) ×4 IMPLANT
SUT PROLENE 3 0 SH DA (SUTURE) IMPLANT
SUT PROLENE 4 0 RB 1 (SUTURE) ×10
SUT PROLENE 4-0 RB1 .5 CRCL 36 (SUTURE) ×10 IMPLANT
SUT PROLENE 5 0 RB 2 (SUTURE) ×8 IMPLANT
SUT SILK 2 0 SH CR/8 (SUTURE) ×4 IMPLANT
SUT STEEL 6MS V (SUTURE) IMPLANT
SUT STEEL STERNAL CCS#1 18IN (SUTURE) IMPLANT
SUT STEEL SZ 6 DBL 3X14 BALL (SUTURE) ×12 IMPLANT
SUT VIC AB 1 CTX 36 (SUTURE) ×6
SUT VIC AB 1 CTX36XBRD ANBCTR (SUTURE) ×6 IMPLANT
SUT VIC AB 2-0 CT1 27 (SUTURE)
SUT VIC AB 2-0 CT1 TAPERPNT 27 (SUTURE) IMPLANT
SUT VIC AB 3-0 X1 27 (SUTURE) IMPLANT
SUTURE E-PAK OPEN HEART (SUTURE) ×4 IMPLANT
SYSTEM SAHARA CHEST DRAIN ATS (WOUND CARE) ×4 IMPLANT
TAPE CLOTH SURG 4X10 WHT LF (GAUZE/BANDAGES/DRESSINGS) ×4 IMPLANT
TAPE PAPER 2X10 WHT MICROPORE (GAUZE/BANDAGES/DRESSINGS) ×4 IMPLANT
TOWEL OR 17X24 6PK STRL BLUE (TOWEL DISPOSABLE) ×4 IMPLANT
TOWEL OR 17X26 10 PK STRL BLUE (TOWEL DISPOSABLE) ×4 IMPLANT
TRAY FOLEY IC TEMP SENS 14FR (CATHETERS) IMPLANT
UNDERPAD 30X30 INCONTINENT (UNDERPADS AND DIAPERS) ×4 IMPLANT
VALVE AOR 27XLF MSTR (Prosthesis & Implant Heart) ×2 IMPLANT
VALVE AORTIC COND (Prosthesis & Implant Heart) ×2 IMPLANT
WATER STERILE IRR 1000ML POUR (IV SOLUTION) ×8 IMPLANT

## 2014-04-13 NOTE — Anesthesia Procedure Notes (Signed)
Procedure Name: Intubation Date/Time: 04/13/2014 9:28 AM Performed by: Rogelia BogaMUELLER, Henrry Feil P Pre-anesthesia Checklist: Patient identified, Emergency Drugs available, Suction available, Patient being monitored and Timeout performed Patient Re-evaluated:Patient Re-evaluated prior to inductionOxygen Delivery Method: Circle system utilized Preoxygenation: Pre-oxygenation with 100% oxygen Intubation Type: IV induction Ventilation: Mask ventilation without difficulty and Oral airway inserted - appropriate to patient size Laryngoscope Size: Mac and 4 Grade View: Grade I Tube type: Oral Tube size: 8.0 mm Number of attempts: 1 Airway Equipment and Method: Stylet Placement Confirmation: ETT inserted through vocal cords under direct vision,  positive ETCO2 and breath sounds checked- equal and bilateral Secured at: 22 cm Tube secured with: Tape Dental Injury: Teeth and Oropharynx as per pre-operative assessment

## 2014-04-13 NOTE — Anesthesia Preprocedure Evaluation (Addendum)
Anesthesia Evaluation  Patient identified by MRN, date of birth, ID band Patient awake    Reviewed: Allergy & Precautions, NPO status , Patient's Chart, lab work & pertinent test results  Airway Mallampati: II  TM Distance: >3 FB     Dental  (+) Teeth Intact, Dental Advisory Given   Pulmonary shortness of breath and with exertion, former smoker,          Cardiovascular + Peripheral Vascular Disease + Valvular Problems/Murmurs AI     Neuro/Psych    GI/Hepatic   Endo/Other  Morbid obesity  Renal/GU      Musculoskeletal   Abdominal   Peds  Hematology   Anesthesia Other Findings   Reproductive/Obstetrics                            Anesthesia Physical Anesthesia Plan  ASA: IV  Anesthesia Plan: General   Post-op Pain Management:    Induction: Intravenous  Airway Management Planned: Oral ETT  Additional Equipment: Arterial line, CVP, PA Cath and TEE  Intra-op Plan:   Post-operative Plan: Post-operative intubation/ventilation  Informed Consent: I have reviewed the patients History and Physical, chart, labs and discussed the procedure including the risks, benefits and alternatives for the proposed anesthesia with the patient or authorized representative who has indicated his/her understanding and acceptance.   Dental advisory given  Plan Discussed with: CRNA, Anesthesiologist and Surgeon  Anesthesia Plan Comments:        Anesthesia Quick Evaluation

## 2014-04-13 NOTE — Progress Notes (Signed)
RT placed patient on 40% and a rate of 4.  RN aware. RT will continue to monitor.

## 2014-04-13 NOTE — Op Note (Signed)
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 Median Sternotomy 2. Extracorporeal circulation 3.   Replacement of aortic valve and aortic root with a 27 mm St. Jude mechanical valved conduit with reimplantation of the coronary arteries Sales executive Procedure)  Anesthesia:  General Endotracheal   Clinical History/Surgical Indication:   The patient has no prior medical history and began having a cough about 2 weeks ago with some mild shortness of breath. He was treated with an antibiotic at [facility] in [place]. His cough persisted and he developed a little swelling in his ankles so he presented to the [facility] again and had a CXR showing some pulmonary congestion. CT of the chest there apparently showed some patchy left upper and right lower lobe ground glass densities and pleural effusions consistent with pulmonary edema. He was sent to the [facility] ED but could not wait so he went to the [facility] ER and was admitted. 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 It is unclear if there is a bicuspid AV. 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 The aorta returned to normal size above the STJ. Cath shows normal coronaries.  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 This will require Bentall Procedure. I would use a mechanical valve given the patients age. I discussed the pros and cons of mechanical and tissue valves with him including the need for lifelong anticoagulation with coumadin for a mechanical valve and he is in agreement with a  mechanical valve. He is familiar with coumadin since his mother takes it. He is a reliable patient and has no contraindication to anticoagulation. I discussed the operative procedure with the patient and family including alternatives, benefits and risks; including but not limited to bleeding, blood transfusion, infection, stroke, myocardial infarction, graft failure, heart block requiring a permanent pacemaker, organ dysfunction, and death. Casimer Bilis understands and agrees to proceed.   Preparation:  The patient was seen in the preoperative holding area and the correct patient, correct operation were confirmed with the patient after reviewing the medical record and catheterization. The consent was signed by me. Preoperative antibiotics were given. A pulmonary arterial line and radial arterial line were placed by the anesthesia team. The patient was taken back to the operating room and positioned supine on the operating room table. After being placed under general endotracheal anesthesia by the anesthesia team a foley catheter was placed. The neck, chest, abdomen, and both legs were prepped with betadine soap and solution and draped in the usual sterile manner. A surgical time-out was taken and the correct patient and operative procedure were confirmed with the nursing and anesthesia staff. The patient had severe pulmonary hypertension with a PA pressure of 65/36.  Pre-bypassTEE: Performed by Dr. Diamantina Monks  This showed a large aneurysm of the aortic root primarily involving the right sinus of valsalva. There was severe AI with mild MR and fluttering of the anterior leaflet of the mitral valve due to the AI. The LV was mildly dilated and had mild global hypokinesis.  Cardiopulmonary Bypass:  A median sternotomy was performed. The pericardium was opened in the midline. The right sinus of valsalva was firmly adherent to the RV outflow tract. There were moderate adhesions between the aorta and the  pericardium. Right ventricular function appeared normal. The ascending aorta was of normal size and had no palpable plaque. The aneurysm appeared to be limited to the aortic root. There were no contraindications to aortic cannulation or cross-clamping. The patient was fully systemically heparinized and the ACT was maintained > 400 sec. The proximal aortic arch was cannulated with a 20 F aortic cannula for arterial inflow. Venous cannulation was performed via the right atrial appendage using a two-staged venous cannula. Aortic occlusion was performed with a single cross-clamp. Systemic cooling to 32 degrees Centigrade and topical cooling of the heart with iced saline were used. Hyperkalemic antegrade cold blood cardioplegia was infused directly into the coronary ostia using a handheld cannula to induce diastolic arrest and was then given at about 20 minute intervals throughout the period of arrest to maintain myocardial temperature at or below 10 degrees centigrade. I could not get the retrograde cannula to go into the coronary sinus and could not use aortic root cardioplegia due to AI. A temperature probe was inserted into the interventricular septum and an insulating pad was placed in the pericardium. CO2 was insufflated into the pericardium throughout the case to minimize intracardiac air.    Bentall Procedure:   The ascending aorta was mobilized from the right pulmonary artery and main PA. It was divided transversely and the valve inspected. There were 3 leaflets and the right cusp was completely prolapsed due to lack of attachment of the annulus to the right sinus aorta. This was a contained rupture of the right sinus. The right and left coronary arteries were removed from the aortic root with a button of aortic wall around the ostia. They were retracted carefully out of the way with stay sutures to prevent rotation. The native valve was excised taking care to remove all particulate debri.  The annulus  was sized and a 27 mm St. Jude Mechanical Valved Graft was chosen. ( Ref # [id number], Serial # [id number]) A series of pledgetted 2-0 Ethibond horizontal mattress sutures were placed around the annulus with the pledgets in a sub-annular position. The sutures were placed through a strip of autologous pericardium to reinforce the annulus and then the valve sewing ring. The valve was lowered into place and the sutures at the hinge posts tied first followed by the remaining sutures. The valve seated nicely. The discs moved normally. Small openings were made in the graft for the coronary anastomoses using a thermal cautery. Then the left and right coronary buttons were anastomosed to the graft in an end to side manner using continuous 5-0 prolene suture. A light coating of CoSeal was applied to each anastomosis for hemostasis. The graft was then cut to the appropriate length and anastomosed end to end to the distal ascending aorta using continuous 4-0 prolene suture with a felt strip to reinforce the anastomosis. . CoSeal was applied to seal the needle holes in the grafts. A vent cannula was placed into the graft to remove any air. Deairing maneuvers were performed and the bed placed in trendelenburg position.   Completion:  The patient was rewarmed to 37 degrees Centigrade. The crossclamp was removed with a time of 176 minutes. There was spontaneous return of sinus rhythm. The distal and proximal anastomoses were checked for hemostasis. The vascular anastomoses  all appeared hemostatic. Two temporary epicardial pacing wires were placed on the right atrium and two on the right ventricle. The patient was weaned from CPB without difficulty on dopamine at 3 mcg. CPB time was 216 minutes. Cardiac output was 9 LPM. TEE showed normal prosthetic valve function with no AI. LV function appeared improved and only mildly hypokinetic. There was trivial central MR.  Heparin was fully reversed with protamine and the aortic and  venous cannulas removed. Hemostasis was achieved. Mediastinal and left pleural drainage tubes were placed. The sternum was closed with double #6 stainless steel wires. The fascia was closed with continuous # 1 vicryl suture. The subcutaneous tissue was closed with 2-0 vicryl continuous suture. The skin was closed with 3-0 vicryl subcuticular suture. All sponge, needle, and instrument counts were reported correct at the end of the case. Dry sterile dressings were placed over the incisions and around the chest tubes which were connected to pleurevac suction. The patient was then transported to the surgical intensive care unit in critical but stable condition.

## 2014-04-13 NOTE — Progress Notes (Signed)
Patient ID: Vincent [name], male   DOB: 12/01/1960, 54 y.o.   MRN: [id number]   SICU Evening Rounds:   Hemodynamically stable  CI = 2.7  Has started to wake up on vent. Moves all extremities to command   Urine output good  CT output low  CBC    Component Value Date/Time   WBC 22.0* 04/13/2014 1537   RBC 4.42 04/13/2014 1537   HGB 13.6 04/13/2014 1537   HGB 14.2 04/13/2014 1537   HCT 40.0 04/13/2014 1537   HCT 40.9 04/13/2014 1537   PLT 128* 04/13/2014 1537   MCV 92.5 04/13/2014 1537   MCH 32.1 04/13/2014 1537   MCHC 34.7 04/13/2014 1537   RDW 13.0 04/13/2014 1537   LYMPHSABS 2.0 04/09/2014 2255   MONOABS 0.8 04/09/2014 2255   EOSABS 0.1 04/09/2014 2255   BASOSABS 0.0 04/09/2014 2255     BMET    Component Value Date/Time   NA 140 04/13/2014 1537   K 4.4 04/13/2014 1537   CL 105 04/13/2014 1423   CO2 23 04/13/2014 0340   GLUCOSE 93 04/13/2014 1537   BUN 21 04/13/2014 1423   CREATININE 0.90 04/13/2014 1423   CALCIUM 8.9 04/13/2014 0340   GFRNONAA >90 04/13/2014 0340   GFRAA >90 04/13/2014 0340     A/P:  Stable postop course. Continue current plans

## 2014-04-13 NOTE — Procedures (Signed)
Extubation Procedure Note  Patient Details:   Name: Vincent [name] DOB: [birth year] MRN: [id number]   Airway Documentation:     Evaluation  O2 sats: stable throughout Complications: No apparent complications Patient did tolerate procedure well. Bilateral Breath Sounds: Clear   Yes Patient extubated to 50% venturi mask. Nif=-40 with VC of .800-.900. No stridor heard over upper airway after extubation. Vincent [name], Vincent [name] 04/13/2014, 7:29 PM

## 2014-04-13 NOTE — Progress Notes (Signed)
Echocardiogram Echocardiogram Transesophageal has been performed.  Vincent [name], Vincent [name] 04/13/2014, 8:35 AM

## 2014-04-13 NOTE — Transfer of Care (Signed)
Immediate Anesthesia Transfer of Care Note  Patient: Vincent [name]  Procedure(s) Performed: Procedure(s): BENTALL PROCEDURE (N/A) TRANSESOPHAGEAL ECHOCARDIOGRAM (TEE) (N/A)  Patient Location: SICU  Anesthesia Type:General  Level of Consciousness: sedated and Patient remains intubated per anesthesia plan  Airway & Oxygen Therapy: Patient placed on Ventilator (see vital sign flow sheet for setting)  Post-op Assessment: Post -op Vital signs reviewed and stable and Report given to MenomonieNiki, RN  Post vital signs: Reviewed and stable  Complications: No apparent anesthesia complications

## 2014-04-13 NOTE — Progress Notes (Signed)
Belongings pack and will be sent with wife and mother who are at the bedside.

## 2014-04-13 NOTE — Brief Op Note (Addendum)
04/09/2014 - 04/13/2014  1:52 PM  PATIENT:  Vincent [name]  54 y.o. male  PRE-OPERATIVE DIAGNOSIS:  Aortic root aneurysm, severe aortic insufficiency  POST-OPERATIVE DIAGNOSIS:  Aortic root aneurysm, severe aortic insufficiency  PROCEDURE:   BENTALL PROCEDURE (27b mm St. Jude mechanical aortic valve conduit, reimplantation of left and right coronary arteries)  SURGEON:  Alleen BorneBryan K Bartle, MD  ASSISTANT: Coral CeoGina Derris Millan, PA-C  ANESTHESIA:   general  PATIENT CONDITION:  ICU - intubated and hemodynamically stable.  PRE-OPERATIVE WEIGHT: 88 kg   Aortic Valve Etiology   Aortic Insufficiency:  Severe  Aortic Valve Disease:  Yes.  Aortic Stenosis:  No.  Etiology (Choose at least one and up to  5 etiologies):  Aortic root aneurysm  Aortic Valve  Procedure Performed:  Replacement: Yes.  Other Valve conduit. Implant Model [id number]:[id number], Size:27, Unique Device Identifier:[id number]  Repair/Reconstruction: Yes.  Root replacement with valved conduit (Bentall) Implant Model [id number]:[id number], Size:27, Unique Device Identifier:[id number]   Aortic Annular Enlargement: No.

## 2014-04-13 NOTE — Progress Notes (Signed)
Patient transported to OR via stretcher, CCMD notified of removal of tele. Family with patient, belongings taken by family except for 1 bag labled with patient sticker. Pre-op checklist completed, consents in chart.

## 2014-04-13 NOTE — Progress Notes (Signed)
Patient currently in the ICU on Dr. Sharee PimpleBartle's service.  [facility] will be available as needed.  Crista Curborinna Jude Linck, MD [facility]

## 2014-04-13 NOTE — Progress Notes (Signed)
Called Dr. Laneta SimmersBartle to update on repeat ABG result.  Advised pt now on 100% NRB.  Dr. Laneta SimmersBartle to continue to monitor and work on IS.   Results for Vincent [name], Vincent [name] (MRN [id number]) as of 04/13/2014 23:21  Ref. Range 04/13/2014 20:34  Sample type No range found ARTERIAL  pH, Arterial Latest Range: 7.350-7.450  7.346 (L)  pCO2 arterial Latest Range: 35.0-45.0 mmHg 37.6  pO2, Arterial Latest Range: 80.0-100.0 mmHg 61.0 (L)  Bicarbonate Latest Range: 20.0-24.0 mEq/L 20.4  TCO2 Latest Range: 0-100 mmol/L 21  Acid-base deficit Latest Range: 0.0-2.0 mmol/L 4.0 (H)  O2 Saturation No range found 88.0  Patient temperature No range found 38.1 C  Collection site No range found ARTERIAL LINE

## 2014-04-13 NOTE — Anesthesia Postprocedure Evaluation (Signed)
  Anesthesia Post-op Note  Patient: Vincent [name]  Procedure(s) Performed: Procedure(s): BENTALL PROCEDURE (N/A) TRANSESOPHAGEAL ECHOCARDIOGRAM (TEE) (N/A)  Patient Location: SICU  Anesthesia Type:General  Level of Consciousness: sedated and Patient remains intubated per anesthesia plan  Airway and Oxygen Therapy: Patient remains intubated per anesthesia plan and Patient placed on Ventilator (see vital sign flow sheet for setting)  Post-op Pain: none  Post-op Assessment: Post-op Vital signs reviewed, Patient's Cardiovascular Status Stable, Respiratory Function Stable, Patent Airway, No signs of Nausea or vomiting and Pain level controlled  Post-op Vital Signs: stable  Last Vitals:  Filed Vitals:   04/13/14 0542  BP: 132/62  Pulse: 99  Temp: 36.7 C  Resp: 18    Complications: No apparent anesthesia complications

## 2014-04-14 ENCOUNTER — Inpatient Hospital Stay (HOSPITAL_COMMUNITY): Payer: BLUE CROSS/BLUE SHIELD

## 2014-04-14 DIAGNOSIS — Z954 Presence of other heart-valve replacement: Secondary | ICD-10-CM

## 2014-04-14 DIAGNOSIS — Z952 Presence of prosthetic heart valve: Secondary | ICD-10-CM | POA: Insufficient documentation

## 2014-04-14 LAB — GLUCOSE, CAPILLARY
GLUCOSE-CAPILLARY: 109 mg/dL — AB (ref 70–99)
GLUCOSE-CAPILLARY: 129 mg/dL — AB (ref 70–99)
GLUCOSE-CAPILLARY: 145 mg/dL — AB (ref 70–99)
Glucose-Capillary: 132 mg/dL — ABNORMAL HIGH (ref 70–99)
Glucose-Capillary: 109 mg/dL — ABNORMAL HIGH (ref 70–99)

## 2014-04-14 LAB — BASIC METABOLIC PANEL
ANION GAP: 5 (ref 5–15)
BUN: 20 mg/dL (ref 6–23)
CHLORIDE: 108 meq/L (ref 96–112)
CO2: 22 mmol/L (ref 19–32)
CREATININE: 0.86 mg/dL (ref 0.50–1.35)
Calcium: 7.9 mg/dL — ABNORMAL LOW (ref 8.4–10.5)
GFR calc Af Amer: >90 mL/min (ref >90)
GFR calc non Af Amer: >90 mL/min (ref >90)
Glucose, Bld: 139 mg/dL — ABNORMAL HIGH (ref 70–99)
POTASSIUM: 4.5 mmol/L (ref 3.5–5.1)
SODIUM: 135 mmol/L (ref 135–145)

## 2014-04-14 LAB — CBC
HCT: 44.4 % (ref 39.0–52.0)
HEMATOCRIT: 43.5 % (ref 39.0–52.0)
HEMOGLOBIN: 15.5 g/dL (ref 13.0–17.0)
HEMOGLOBIN: 15 g/dL (ref 13.0–17.0)
MCH: 32.1 pg (ref 26.0–34.0)
MCH: 32 pg (ref 26.0–34.0)
MCHC: 34.9 g/dL (ref 30.0–36.0)
MCHC: 34.5 g/dL (ref 30.0–36.0)
MCV: 91.9 fL (ref 78.0–100.0)
MCV: 92.8 fL (ref 78.0–100.0)
PLATELETS: 152 10*3/uL (ref 150–400)
PLATELETS: 138 10*3/uL — AB (ref 150–400)
RBC: 4.83 MIL/uL (ref 4.22–5.81)
RBC: 4.69 MIL/uL (ref 4.22–5.81)
RDW: 13.1 % (ref 11.5–15.5)
RDW: 13.2 % (ref 11.5–15.5)
WBC: 22.3 10*3/uL — AB (ref 4.0–10.5)
WBC: 23.8 10*3/uL — ABNORMAL HIGH (ref 4.0–10.5)

## 2014-04-14 LAB — POCT I-STAT, CHEM 8
BUN: 24 mg/dL — AB (ref 6–23)
Calcium, Ion: 1.18 mmol/L (ref 1.12–1.23)
Chloride: 100 mEq/L (ref 96–112)
Creatinine, Ser: 0.8 mg/dL (ref 0.50–1.35)
GLUCOSE: 153 mg/dL — AB (ref 70–99)
HEMATOCRIT: 48 % (ref 39.0–52.0)
HEMOGLOBIN: 16.3 g/dL (ref 13.0–17.0)
Potassium: 4.3 mmol/L (ref 3.5–5.1)
Sodium: 135 mmol/L (ref 135–145)
TCO2: 22 mmol/L (ref 0–100)

## 2014-04-14 LAB — MAGNESIUM
MAGNESIUM: 2.1 mg/dL (ref 1.5–2.5)
Magnesium: 1.9 mg/dL (ref 1.5–2.5)

## 2014-04-14 LAB — CREATININE, SERUM
CREATININE: 0.88 mg/dL (ref 0.50–1.35)
GFR calc Af Amer: >90 mL/min (ref >90)

## 2014-04-14 MED ORDER — FUROSEMIDE 10 MG/ML IJ SOLN
40.0000 mg | Freq: Once | INTRAMUSCULAR | Status: AC
Start: 2014-04-14 — End: 2014-04-14
  Administered 2014-04-14: 40 mg via INTRAVENOUS
  Filled 2014-04-14: qty 4

## 2014-04-14 MED ORDER — CETYLPYRIDINIUM CHLORIDE 0.05 % MT LIQD
7.0000 mL | Freq: Two times a day (BID) | OROMUCOSAL | Status: DC
  Administered 2014-04-14 – 2014-04-20 (×12): 7 mL via OROMUCOSAL

## 2014-04-14 MED ORDER — METOPROLOL TARTRATE 12.5 MG HALF TABLET
12.5000 mg | ORAL_TABLET | Freq: Two times a day (BID) | ORAL | Status: DC
  Administered 2014-04-14 – 2014-04-15 (×2): 12.5 mg via ORAL
  Filled 2014-04-14 (×3): qty 1

## 2014-04-14 MED ORDER — INSULIN ASPART 100 UNIT/ML ~~LOC~~ SOLN
0.0000 [IU] | SUBCUTANEOUS | Status: DC
  Administered 2014-04-14 – 2014-04-15 (×5): 2 [IU] via SUBCUTANEOUS

## 2014-04-14 MED ORDER — FUROSEMIDE 10 MG/ML IJ SOLN
40.0000 mg | Freq: Once | INTRAMUSCULAR | Status: AC
  Administered 2014-04-14: 40 mg via INTRAVENOUS
  Filled 2014-04-14: qty 4

## 2014-04-14 MED ORDER — ENOXAPARIN SODIUM 40 MG/0.4ML ~~LOC~~ SOLN
40.0000 mg | Freq: Every day | SUBCUTANEOUS | Status: DC
  Administered 2014-04-14: 40 mg via SUBCUTANEOUS
  Filled 2014-04-14 (×2): qty 0.4

## 2014-04-14 MED ORDER — POTASSIUM CHLORIDE CRYS ER 20 MEQ PO TBCR
20.0000 meq | EXTENDED_RELEASE_TABLET | Freq: Once | ORAL | Status: AC
  Administered 2014-04-14: 20 meq via ORAL
  Filled 2014-04-14: qty 1

## 2014-04-14 NOTE — Progress Notes (Signed)
Patient ID: Vincent [name], male   DOB: 1960/09/03, 54 y.o.   MRN: [id number]  TCTS Evening Rounds:  Hemodynamically stable off vasopressors. Sinus tach 100-110. Will start lopressor Diuresed this am with lasix. Will give a dose tonight. Ambulated 3 laps today.

## 2014-04-14 NOTE — Progress Notes (Signed)
1 Day Post-Op Procedure(s) (LRB): BENTALL PROCEDURE (N/A) TRANSESOPHAGEAL ECHOCARDIOGRAM (TEE) (N/A) Subjective:  No complaints  Objective: Vital signs in last 24 hours: Temp:  [98.1 F (36.7 C)-101.1 F (38.4 C)] 99.3 F (37.4 C) (01/09 0700) Pulse Rate:  [77-101] 96 (01/09 0700) Cardiac Rhythm:  [-] Atrial paced (01/09 0800) Resp:  [12-24] 18 (01/09 0700) BP: (81-121)/(54-78) 117/76 mmHg (01/09 0700) SpO2:  [89 %-99 %] 96 % (01/09 0700) Arterial Line BP: (78-130)/(52-79) 125/68 mmHg (01/09 0700) FiO2 (%):  [40 %-100 %] 50 % (01/09 0600) Weight:  [91.9 kg (202 lb 9.6 oz)] 91.9 kg (202 lb 9.6 oz) (01/09 0500)  Hemodynamic parameters for last 24 hours: PAP: (23-48)/(11-28) 37/24 mmHg CO:  [3.7 L/min-6 L/min] 6 L/min CI:  [1.9 L/min/m2-2.8 L/min/m2] 2.8 L/min/m2  Intake/Output from previous day: 01/08 0701 - 01/09 0700 In: 5555.6 [I.V.:4258.6; Blood:1097; IV Piggyback:200] Out: 3995 [Urine:1595; Emesis/NG output:100; Blood:1850; Chest Tube:450] Intake/Output this shift:    General appearance: alert and cooperative Neurologic: intact Heart: regular rate and rhythm and crisp mechanical valve click Lungs: clear to auscultation bilaterally Extremities: edema mild Wound: dressing dry  Lab Results:  Recent Labs  04/13/14 2100 04/13/14 2113 04/14/14 0410  WBC 19.9*  --  22.3*  HGB 15.5 15.6 15.5  HCT 44.2 46.0 44.4  PLT 147*  --  152   BMET:  Recent Labs  04/13/14 0340  04/13/14 2113 04/14/14 0410  NA 138  < > 139 135  K 3.8  < > 4.6 4.5  CL 107  < > 107 108  CO2 23  --   --  22  GLUCOSE 80  < > 132* 139*  BUN 17  < > 21 20  CREATININE 0.97  < > 0.90 0.86  CALCIUM 8.9  --   --  7.9*  < > = values in this interval not displayed.  PT/INR:  Recent Labs  04/13/14 1537  LABPROT 18.5*  INR 1.52*   ABG    Component Value Date/Time   PHART 7.346* 04/13/2014 2034   HCO3 20.4 04/13/2014 2034   TCO2 18 04/13/2014 2113   ACIDBASEDEF 4.0* 04/13/2014 2034   O2SAT 88.0 04/13/2014 2034   CBG (last 3)   Recent Labs  04/13/14 1856 04/14/14 0043 04/14/14 0412  GLUCAP 126* 109* 132*   CLINICAL DATA: Post AVR  EXAM: PORTABLE CHEST - 1 VIEW  COMPARISON: 04/13/2013; 04/09/2013; chest CT - 04/11/2013  FINDINGS: Grossly unchanged enlarged cardiac silhouette and mediastinal contours post median sternotomy in aortic valve replacement. Interval extubation and removal of enteric tube. Remaining support apparatus is unchanged positioning. No pneumothorax. Lung volumes remain reduced with grossly unchanged bibasilar heterogeneous opacities, left greater than right. Overall improved aeration lungs with persistent findings of pulmonary venous congestion. Trace bilateral effusions are not excluded. Unchanged bones.  IMPRESSION: 1. Interval extubation and removal of enteric tube. Otherwise, stable position of support apparatus. No pneumothorax. 2. Minimally improved aeration of the lungs suggests resolving edema and atelectasis. 3. Persistent hypoventilation with bibasilar opacities, left greater than right, likely atelectasis.   Electronically Signed  By: Simonne Come M.D.  On: 04/14/2014 07:58   ECG: sinus, no acute changes. Assessment/Plan: S/P Procedure(s) (LRB): BENTALL PROCEDURE (N/A) TRANSESOPHAGEAL ECHOCARDIOGRAM (TEE) (N/A)  Hemodynamically stable on low dose neo. Wean as tolerated. Hold off on beta blocker until stable off vasopressor. Mobilize Diuresis d/c tubes/lines Continue foley due to diuresing patient and patient in ICU See progression orders  Start coumadin tomorrow.    LOS: 5 days  [name],[name] 04/14/2014

## 2014-04-15 ENCOUNTER — Inpatient Hospital Stay (HOSPITAL_COMMUNITY): Payer: BLUE CROSS/BLUE SHIELD

## 2014-04-15 LAB — GLUCOSE, CAPILLARY
GLUCOSE-CAPILLARY: 106 mg/dL — AB (ref 70–99)
Glucose-Capillary: 138 mg/dL — ABNORMAL HIGH (ref 70–99)
Glucose-Capillary: 137 mg/dL — ABNORMAL HIGH (ref 70–99)
Glucose-Capillary: 129 mg/dL — ABNORMAL HIGH (ref 70–99)

## 2014-04-15 LAB — BASIC METABOLIC PANEL
ANION GAP: 8 (ref 5–15)
BUN: 20 mg/dL (ref 6–23)
CALCIUM: 8.3 mg/dL — AB (ref 8.4–10.5)
CO2: 27 mmol/L (ref 19–32)
CREATININE: 0.91 mg/dL (ref 0.50–1.35)
Chloride: 100 mEq/L (ref 96–112)
GFR calc Af Amer: >90 mL/min (ref >90)
GFR calc non Af Amer: >90 mL/min (ref >90)
GLUCOSE: 127 mg/dL — AB (ref 70–99)
POTASSIUM: 4.4 mmol/L (ref 3.5–5.1)
Sodium: 135 mmol/L (ref 135–145)

## 2014-04-15 LAB — PROTIME-INR
INR: 1.42 (ref 0.00–1.49)
Prothrombin Time: 17.5 seconds — ABNORMAL HIGH (ref 11.6–15.2)

## 2014-04-15 LAB — CBC
HCT: 41.9 % (ref 39.0–52.0)
Hemoglobin: 14.4 g/dL (ref 13.0–17.0)
MCH: 32.2 pg (ref 26.0–34.0)
MCHC: 34.4 g/dL (ref 30.0–36.0)
MCV: 93.7 fL (ref 78.0–100.0)
Platelets: 132 10*3/uL — ABNORMAL LOW (ref 150–400)
RBC: 4.47 MIL/uL (ref 4.22–5.81)
RDW: 13.4 % (ref 11.5–15.5)
WBC: 24.8 10*3/uL — ABNORMAL HIGH (ref 4.0–10.5)

## 2014-04-15 MED ORDER — PANTOPRAZOLE SODIUM 40 MG PO TBEC
40.0000 mg | DELAYED_RELEASE_TABLET | Freq: Every day | ORAL | Status: DC
  Administered 2014-04-16 – 2014-04-22 (×7): 40 mg via ORAL
  Filled 2014-04-15 (×7): qty 1

## 2014-04-15 MED ORDER — FUROSEMIDE 40 MG PO TABS
40.0000 mg | ORAL_TABLET | Freq: Every day | ORAL | Status: AC
  Administered 2014-04-16 – 2014-04-19 (×4): 40 mg via ORAL
  Filled 2014-04-15 (×5): qty 1

## 2014-04-15 MED ORDER — OXYCODONE HCL 5 MG PO TABS
5.0000 mg | ORAL_TABLET | ORAL | Status: DC | PRN
  Administered 2014-04-16: 5 mg via ORAL
  Administered 2014-04-16: 10 mg via ORAL
  Administered 2014-04-17 – 2014-04-19 (×5): 5 mg via ORAL
  Administered 2014-04-20: 10 mg via ORAL
  Administered 2014-04-20 – 2014-04-21 (×3): 5 mg via ORAL
  Administered 2014-04-21: 10 mg via ORAL
  Administered 2014-04-21: 5 mg via ORAL
  Filled 2014-04-15 (×6): qty 1
  Filled 2014-04-15 (×2): qty 2
  Filled 2014-04-15: qty 1
  Filled 2014-04-15 (×2): qty 2
  Filled 2014-04-15 (×2): qty 1

## 2014-04-15 MED ORDER — SODIUM CHLORIDE 0.9 % IJ SOLN
3.0000 mL | Freq: Two times a day (BID) | INTRAMUSCULAR | Status: DC
  Administered 2014-04-15 – 2014-04-21 (×12): 3 mL via INTRAVENOUS

## 2014-04-15 MED ORDER — FUROSEMIDE 10 MG/ML IJ SOLN
40.0000 mg | Freq: Once | INTRAMUSCULAR | Status: AC
  Administered 2014-04-15: 40 mg via INTRAVENOUS
  Filled 2014-04-15: qty 4

## 2014-04-15 MED ORDER — POTASSIUM CHLORIDE CRYS ER 20 MEQ PO TBCR
20.0000 meq | EXTENDED_RELEASE_TABLET | Freq: Once | ORAL | Status: AC
  Administered 2014-04-15: 20 meq via ORAL
  Filled 2014-04-15: qty 1

## 2014-04-15 MED ORDER — ACETAMINOPHEN 325 MG PO TABS
650.0000 mg | ORAL_TABLET | Freq: Four times a day (QID) | ORAL | Status: DC | PRN
  Administered 2014-04-17: 650 mg via ORAL
  Filled 2014-04-15 (×2): qty 2

## 2014-04-15 MED ORDER — INSULIN ASPART 100 UNIT/ML ~~LOC~~ SOLN
0.0000 [IU] | Freq: Three times a day (TID) | SUBCUTANEOUS | Status: DC
  Administered 2014-04-15: 2 [IU] via SUBCUTANEOUS

## 2014-04-15 MED ORDER — SODIUM CHLORIDE 0.9 % IJ SOLN: 3.0000 mL | INTRAMUSCULAR | Status: DC | PRN

## 2014-04-15 MED ORDER — TRAMADOL HCL 50 MG PO TABS
50.0000 mg | ORAL_TABLET | ORAL | Status: DC | PRN
  Administered 2014-04-18: 50 mg via ORAL
  Filled 2014-04-15: qty 1

## 2014-04-15 MED ORDER — POTASSIUM CHLORIDE CRYS ER 20 MEQ PO TBCR
20.0000 meq | EXTENDED_RELEASE_TABLET | Freq: Two times a day (BID) | ORAL | Status: AC
  Administered 2014-04-16 – 2014-04-18 (×6): 20 meq via ORAL
  Filled 2014-04-15 (×8): qty 1

## 2014-04-15 MED ORDER — METOPROLOL TARTRATE 25 MG PO TABS
25.0000 mg | ORAL_TABLET | Freq: Two times a day (BID) | ORAL | Status: DC
  Administered 2014-04-15 – 2014-04-17 (×4): 25 mg via ORAL
  Filled 2014-04-15 (×6): qty 1

## 2014-04-15 MED ORDER — ONDANSETRON HCL 4 MG PO TABS: 4.0000 mg | ORAL_TABLET | Freq: Four times a day (QID) | ORAL | Status: DC | PRN

## 2014-04-15 MED ORDER — BISACODYL 10 MG RE SUPP: 10.0000 mg | Freq: Every day | RECTAL | Status: DC | PRN

## 2014-04-15 MED ORDER — MOVING RIGHT ALONG BOOK
Freq: Once | Status: DC
  Filled 2014-04-15: qty 1

## 2014-04-15 MED ORDER — SODIUM CHLORIDE 0.9 % IV SOLN
250.0000 mL | INTRAVENOUS | Status: DC | PRN
Start: 2014-04-15 — End: 2014-04-22

## 2014-04-15 MED ORDER — BISACODYL 5 MG PO TBEC
10.0000 mg | DELAYED_RELEASE_TABLET | Freq: Every day | ORAL | Status: DC | PRN
  Administered 2014-04-17: 10 mg via ORAL
  Filled 2014-04-15: qty 2

## 2014-04-15 MED ORDER — DOCUSATE SODIUM 100 MG PO CAPS
200.0000 mg | ORAL_CAPSULE | Freq: Every day | ORAL | Status: DC
  Administered 2014-04-16 – 2014-04-22 (×6): 200 mg via ORAL
  Filled 2014-04-15 (×7): qty 2

## 2014-04-15 MED ORDER — ONDANSETRON HCL 4 MG/2ML IJ SOLN: 4.0000 mg | Freq: Four times a day (QID) | INTRAMUSCULAR | Status: DC | PRN

## 2014-04-15 NOTE — Progress Notes (Signed)
2 Days Post-Op Procedure(s) (LRB): BENTALL PROCEDURE (N/A) TRANSESOPHAGEAL ECHOCARDIOGRAM (TEE) (N/A) Subjective:  No complaints  Objective: Vital signs in last 24 hours: Temp:  [98.1 F (36.7 C)-98.9 F (37.2 C)] 98.9 F (37.2 C) (01/10 0812) Pulse Rate:  [76-115] 76 (01/10 1000) Cardiac Rhythm:  [-] Sinus tachycardia (01/10 0800) Resp:  [16-31] 21 (01/10 1000) BP: (97-124)/(67-82) 118/82 mmHg (01/10 1000) SpO2:  [91 %-100 %] 100 % (01/10 1000) Weight:  [90.81 kg (200 lb 3.2 oz)] 90.81 kg (200 lb 3.2 oz) (01/10 0500)  Hemodynamic parameters for last 24 hours:    Intake/Output from previous day: 01/09 0701 - 01/10 0700 In: 1600 [P.O.:540; I.V.:960; IV Piggyback:100] Out: 2660 [Urine:2600; Chest Tube:60] Intake/Output this shift: Total I/O In: 80 [I.V.:80] Out: 75 [Urine:75]  General appearance: alert and cooperative Neurologic: intact Heart: regular rate and rhythm and crisp mechanical valve click Lungs: diminished breath sounds bibasilar Extremities: edema mild Wound: dressing dry  Lab Results:  Recent Labs  04/14/14 1656 04/15/14 0306  WBC 23.8* 24.8*  HGB 15.0 14.4  HCT 43.5 41.9  PLT 138* 132*   BMET:  Recent Labs  04/14/14 0410 04/14/14 1651 04/14/14 1656 04/15/14 0306  NA 135 135  --  135  [name] 4.5 4.3  --  4.4  CL 108 100  --  100  CO2 22  --   --  27  GLUCOSE 139* 153*  --  127*  BUN 20 24*  --  20  CREATININE 0.86 0.80 0.88 0.91  CALCIUM 7.9*  --   --  8.3*    PT/INR:  Recent Labs  04/15/14 0306  LABPROT 17.5*  INR 1.42   ABG    Component Value Date/Time   PHART 7.346* 04/13/2014 2034   HCO3 20.4 04/13/2014 2034   TCO2 22 04/14/2014 1651   ACIDBASEDEF 4.0* 04/13/2014 2034   O2SAT 88.0 04/13/2014 2034   CBG (last 3)   Recent Labs  04/14/14 0830 04/14/14 1209 04/14/14 1956  GLUCAP 109* 129* 145*   CLINICAL DATA: 54 year old male status post aortic valve replacement.  EXAM: PORTABLE CHEST - 1 VIEW  COMPARISON:  Chest x-ray 04/14/2014.  FINDINGS: Previously noted Swan-Ganz catheter has been withdrawn. Left IJ Cordis remains in position with tip near the confluence of the left internal jugular vein and innominate vein. Previously noted mediastinal/pericardial drain has been removed. Lung volumes remain low. There continue to be bibasilar opacities, most compatible with areas of resolving postoperative subsegmental atelectasis. Small bilateral pleural effusions. Mild pulmonary venous congestion, without frank pulmonary edema, improve slightly compared to the prior examination. Persistent enlargement of the cardiopericardial silhouette, similar to prior postoperative films. Mild widening of the upper mediastinal contours is unchanged. Mechanical aortic valve noted. Status post median sternotomy.  IMPRESSION: 1. Postoperative changes and support apparatus, as above. 2. Pulmonary venous congestion, with no residual pulmonary edema. 3. Low lung volumes with residual bibasilar subsegmental postoperative atelectasis and small bilateral pleural effusions.   Electronically Signed  By: Trudie Reedaniel Entrikin M.D.  On: 04/15/2014 09:10 Assessment/Plan: S/P Procedure(s) (LRB): BENTALL PROCEDURE (N/A) TRANSESOPHAGEAL ECHOCARDIOGRAM (TEE) (N/A)  He is hemodynamically stable. Sinus tach 110 at rest. Will increase lopressor to 25 bid. Coumadin for mechanical valve Mobilize, IS Diuresis: wt is 5 lbs over preop. Plan for transfer to step-down: see transfer orders   LOS: 6 days    Vincent [name] 04/15/2014

## 2014-04-16 ENCOUNTER — Inpatient Hospital Stay (HOSPITAL_COMMUNITY): Payer: BLUE CROSS/BLUE SHIELD

## 2014-04-16 ENCOUNTER — Encounter (HOSPITAL_COMMUNITY): Payer: Self-pay | Admitting: Surgery

## 2014-04-16 LAB — GLUCOSE, CAPILLARY
Glucose-Capillary: 102 mg/dL — ABNORMAL HIGH (ref 70–99)
Glucose-Capillary: 111 mg/dL — ABNORMAL HIGH (ref 70–99)
Glucose-Capillary: 135 mg/dL — ABNORMAL HIGH (ref 70–99)

## 2014-04-16 LAB — BASIC METABOLIC PANEL
Anion gap: 4 — ABNORMAL LOW (ref 5–15)
BUN: 20 mg/dL (ref 6–23)
CO2: 27 mmol/L (ref 19–32)
CREATININE: 0.73 mg/dL (ref 0.50–1.35)
Calcium: 7.9 mg/dL — ABNORMAL LOW (ref 8.4–10.5)
Chloride: 99 mEq/L (ref 96–112)
GFR calc Af Amer: >90 mL/min (ref >90)
GLUCOSE: 126 mg/dL — AB (ref 70–99)
Potassium: 4 mmol/L (ref 3.5–5.1)
Sodium: 130 mmol/L — ABNORMAL LOW (ref 135–145)

## 2014-04-16 LAB — CBC
HCT: 39.3 % (ref 39.0–52.0)
Hemoglobin: 13.5 g/dL (ref 13.0–17.0)
MCH: 32.7 pg (ref 26.0–34.0)
MCHC: 34.4 g/dL (ref 30.0–36.0)
MCV: 95.2 fL (ref 78.0–100.0)
Platelets: 135 10*3/uL — ABNORMAL LOW (ref 150–400)
RBC: 4.13 MIL/uL — ABNORMAL LOW (ref 4.22–5.81)
RDW: 13.1 % (ref 11.5–15.5)
WBC: 17.7 10*3/uL — ABNORMAL HIGH (ref 4.0–10.5)

## 2014-04-16 LAB — PROTIME-INR
INR: 1.22 (ref 0.00–1.49)
Prothrombin Time: 15.5 seconds — ABNORMAL HIGH (ref 11.6–15.2)

## 2014-04-16 MED ORDER — WARFARIN SODIUM 5 MG PO TABS
5.0000 mg | ORAL_TABLET | Freq: Every day | ORAL | Status: DC
  Administered 2014-04-16 – 2014-04-17 (×2): 5 mg via ORAL
  Filled 2014-04-16 (×3): qty 1

## 2014-04-16 MED ORDER — WARFARIN - PHYSICIAN DOSING INPATIENT
Freq: Every day | Status: DC
  Administered 2014-04-20: 18:00:00

## 2014-04-16 MED FILL — Lidocaine HCl IV Inj 20 MG/ML: INTRAVENOUS | Qty: 10 | Status: AC

## 2014-04-16 MED FILL — Electrolyte-R (PH 7.4) Solution: INTRAVENOUS | Qty: 3000 | Status: AC

## 2014-04-16 MED FILL — Sodium Bicarbonate IV Soln 8.4%: INTRAVENOUS | Qty: 50 | Status: AC

## 2014-04-16 MED FILL — Heparin Sodium (Porcine) Inj 1000 Unit/ML: INTRAMUSCULAR | Qty: 40 | Status: AC

## 2014-04-16 MED FILL — Mannitol IV Soln 20%: INTRAVENOUS | Qty: 500 | Status: AC

## 2014-04-16 MED FILL — Sodium Chloride IV Soln 0.9%: INTRAVENOUS | Qty: 2000 | Status: AC

## 2014-04-16 NOTE — Progress Notes (Signed)
3 Days Post-Op Procedure(s) (LRB): BENTALL PROCEDURE (N/A) TRANSESOPHAGEAL ECHOCARDIOGRAM (TEE) (N/A) Subjective:  No complaints. Slept well  Objective: Vital signs in last 24 hours: Temp:  [98.1 F (36.7 C)-98.9 F (37.2 C)] 98.1 F (36.7 C) (01/11 0400) Pulse Rate:  [88-113] 102 (01/10 1200) Cardiac Rhythm:  [-] Sinus tachycardia (01/11 0400) Resp:  [15-23] 18 (01/11 0400) BP: (109-126)/(71-82) 110/71 mmHg (01/11 0400) SpO2:  [80 %-98 %] 98 % (01/11 0400) Weight:  [87.9 kg (193 lb 12.6 oz)] 87.9 kg (193 lb 12.6 oz) (01/11 0500)  Hemodynamic parameters for last 24 hours:    Intake/Output from previous day: 01/10 0701 - 01/11 0700 In: 950 [P.O.:720; I.V.:230] Out: 1425 [Urine:1425] Intake/Output this shift:    General appearance: alert and cooperative Neurologic: intact Heart: regular rate and rhythm and crisp mechanical valve click Lungs: rales bibasilar Abdomen: soft, non-tender; bowel sounds normal; no masses,  no organomegaly Extremities: extremities normal, atraumatic, no cyanosis or edema Wound: healing well  Lab Results:  Recent Labs  04/15/14 0306 04/16/14 0220  WBC 24.8* 17.7*  HGB 14.4 13.5  HCT 41.9 39.3  PLT 132* 135*   BMET:  Recent Labs  04/15/14 0306 04/16/14 0220  NA 135 130*  K 4.4 4.0  CL 100 99  CO2 27 27  GLUCOSE 127* 126*  BUN 20 20  CREATININE 0.91 0.73  CALCIUM 8.3* 7.9*    PT/INR:  Recent Labs  04/16/14 0220  LABPROT 15.5*  INR 1.22   ABG    Component Value Date/Time   PHART 7.346* 04/13/2014 2034   HCO3 20.4 04/13/2014 2034   TCO2 22 04/14/2014 1651   ACIDBASEDEF 4.0* 04/13/2014 2034   O2SAT 88.0 04/13/2014 2034   CBG (last 3)   Recent Labs  04/15/14 1222 04/15/14 1648 04/15/14 2142  GLUCAP 137* 129* 106*    Assessment/Plan: S/P Procedure(s) (LRB): BENTALL PROCEDURE (N/A) TRANSESOPHAGEAL ECHOCARDIOGRAM (TEE) (N/A)  He remains hemodynamically stable.  Resting sinus tachy about 100-110. Continue  lopressor. Start coumadin 5 today Mobilize and IS Awaiting bed on 2W   LOS: 7 days    Vincent [name] K 04/16/2014

## 2014-04-16 NOTE — Progress Notes (Signed)
CARDIAC REHAB PHASE I   PRE:  Rate/Rhythm: 112 ST  BP:  Supine:   Sitting: 120/80  Standing:    SaO2: 93 RA  MODE:  Ambulation: 550 ft   POST:  Rate/Rhythm: 116  BP:  Supine:   Sitting: 122/80  Standing:    SaO2: 93 RA 1405-1445 Assisted X 1 and used walker to ambulate. Gait steady with walker. Pt able to 550 feet without c/o. Pt to recliner after walk with call light in reach and family present.  Melina CopaLisa Ran Tullis RN 04/16/2014 2:43 PM

## 2014-04-16 NOTE — Progress Notes (Signed)
Report received from 2S at 1120 and pt arrived to the unit at 1140 via ambulation with family and belongings to the side. Pt oriented to the unit and room; pt placed on telemetry and called in to CCMD; vitals taken; pain assessed; pacing wires remain intact, taped to abd; sternum incision clean, dry and open to air; no s/s of drainage or discharge; pt sitting up in chair with call light within reach and family at side. Will continue to monitor quietly. Arabella MerlesP. Amo Miu Chiong RN.

## 2014-04-17 LAB — PROTIME-INR
INR: 1.12 (ref 0.00–1.49)
Prothrombin Time: 14.5 seconds (ref 11.6–15.2)

## 2014-04-17 MED ORDER — METOPROLOL TARTRATE 25 MG PO TABS
25.0000 mg | ORAL_TABLET | Freq: Three times a day (TID) | ORAL | Status: DC
  Administered 2014-04-17 (×2): 25 mg via ORAL
  Filled 2014-04-17 (×5): qty 1

## 2014-04-17 MED ORDER — WARFARIN VIDEO
Freq: Once | Status: AC
  Administered 2014-04-17: 18:00:00

## 2014-04-17 MED ORDER — PATIENT'S GUIDE TO USING COUMADIN BOOK
Freq: Once | Status: AC
  Administered 2014-04-17: 18:00:00
  Filled 2014-04-17: qty 1

## 2014-04-17 MED ORDER — MAGNESIUM HYDROXIDE 400 MG/5ML PO SUSP: 30.0000 mL | Freq: Every day | ORAL | Status: DC | PRN

## 2014-04-17 MED ORDER — LACTULOSE 10 GM/15ML PO SOLN
20.0000 g | Freq: Every day | ORAL | Status: DC | PRN
  Administered 2014-04-17: 20 g via ORAL
  Filled 2014-04-17 (×2): qty 30

## 2014-04-17 NOTE — Progress Notes (Signed)
Pt educated on coumadin; watched the video on blood thinners 109 along with his wife and his mother; pt given his coumadin booklet and all voices understanding and denies any questions. Arabella MerlesP. Amo Roselin Wiemann RN.

## 2014-04-17 NOTE — Progress Notes (Addendum)
       [address]       [place] [id number]             [phone]          4 Days Post-Op Procedure(s) (LRB): BENTALL PROCEDURE (N/A) TRANSESOPHAGEAL ECHOCARDIOGRAM (TEE) (N/A)  Subjective: Feels well.  Walked several times yesterday.  No BM yet, but passing flatus.   Objective: Vital signs in last 24 hours: Patient Vitals for the past 24 hrs:  BP Temp Temp src Pulse Resp SpO2 Weight  04/17/14 0402 120/86 mmHg 99.6 F (37.6 C) Oral (!) 110 18 92 % 196 lb 13.9 oz (89.3 kg)  04/16/14 2026 111/71 mmHg 98.2 F (36.8 C) Oral (!) 110 18 94 % -  04/16/14 1150 116/69 mmHg 98.1 F (36.7 C) Oral (!) 108 20 92 % -  04/16/14 1100 - - - - - 91 % -   Current Weight  04/17/14 196 lb 13.9 oz (89.3 kg)  PRE-OPERATIVE WEIGHT: 88 kg   Intake/Output from previous day: 01/11 0701 - 01/12 0700 In: -  Out: 1500 [Urine:1500]    PHYSICAL EXAM:  Heart: RRR, good valve click Lungs: Clear Wound: Clean and dry Extremities: Mild LE edema    Lab Results: CBC:  Recent Labs  04/15/14 0306 04/16/14 0220  WBC 24.8* 17.7*  HGB 14.4 13.5  HCT 41.9 39.3  PLT 132* 135*   BMET:   Recent Labs  04/15/14 0306 04/16/14 0220  NA 135 130*  K 4.4 4.0  CL 100 99  CO2 27 27  GLUCOSE 127* 126*  BUN 20 20  CREATININE 0.91 0.73  CALCIUM 8.3* 7.9*    PT/INR:   Recent Labs  04/17/14 0506  LABPROT 14.5  INR 1.12      Assessment/Plan: S/P Procedure(s) (LRB): BENTALL PROCEDURE (N/A) TRANSESOPHAGEAL ECHOCARDIOGRAM (TEE) (N/A)  CV- Still mildly tachy at rest, BPs stable. Lopressor increased yesterday, may need further titration. Will monitor. Continue Coumadin load for mechanical valve.  Vol overload- diurese.  Leukocytosis- WBC trending down, no fever. Continue pulm toilet and watch.  GI- LOC today.  Hopefully home in next few days once INR trending up and bowels working.  Continue CRPI, pulm toilet.   LOS: 8 days    COLLINS,GINA  H 04/17/2014   Chart reviewed, patient examined, agree with above. Continue coumadin 5 mg daily

## 2014-04-17 NOTE — Progress Notes (Signed)
Utilization review completed.  

## 2014-04-17 NOTE — Discharge Summary (Signed)
Physician Discharge Summary  Patient ID: Vincent [name] MRN: [id number] DOB/AGE: April 13, 1960 54 y.o.  Admit date: 04/09/2014 Discharge date: 04/22/2014  Admission Diagnoses:  Patient Active Problem List   Diagnosis Date Noted  . Cardiomegaly 04/10/2014  . Pneumonia 04/10/2014  . Severe aortic regurgitation 04/10/2014  . Aortic root dilatation 04/10/2014  . Obesity (BMI 30-39.9) 04/10/2014  . SOB (shortness of breath)   . Pleural effusion 04/09/2014   Discharge Diagnoses:   Patient Active Problem List   Diagnosis Date Noted  . Tachycardia 04/19/2014  . S/P AVR (aortic valve replacement)   . S/P AVR 04/13/2014  . Cardiomegaly 04/10/2014  . Pneumonia 04/10/2014  . Severe aortic regurgitation 04/10/2014  . Aortic root dilatation 04/10/2014  . Obesity (BMI 30-39.9) 04/10/2014  . SOB (shortness of breath)   . Pleural effusion 04/09/2014   Discharged Condition: good  History of Present Illness:   Vincent [name] is a 54 yo white male with no prior medical history.  He presented to an Urgent Care in [place], [place] with complaints of cough and some mild shortness of breath.  He was given an antibiotic which did not help his symptoms.  His cough persisted and developed some swelling in his ankles.  He presented to [facility] again at which time CXR was obtained and showed pulmonary congestion.  Further workup consisted of CT scan of the chest which showed some patchy left upper and right lower lobe ground glass densities and pleural effusions consistent with pulmonary edema.  Due to these findings he was sent to the local ED, but he was unable to wait to be seen so he presented to the [facility] ED and was later admitted.  Physical exam noted to have a diastolic murmur and Echocardiogram showed severe Aortic Insufficiency with a markedly dilated Aortic Root.  Due to these findings he was transferred to [facility] for further workup.   Hospital Course:   Upon arrival to [facility] the  patient underwent evaluation by Cardiology.  Catheterization showed no evidence of Coronary artery disease, but demonstrated significant aortic regurgitation and aortic aneurysm.  Surgical intervention was felt to be required and Cardiothoracic surgery consult was required.  The patient was evaluated by Vincent [name] and he was in agreement surgical intervention would be required.  The risks and benefits of the procedure were explained to the patient and was agreeable to proceed.  The patient was taken to the operating room on 04/13/2014.  He underwent Bentall procedure with a 27 mm St. Jude Mechanical valve conduit with reimplantation of the coronary arteries.  He tolerated the procedure well and was taken to the SICU in stable condition.  The patient was extubated the evening of surgery.  During his stay in the SICU the patient was weaned off Neo synephrine drip as tolerated.  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 He was hypervolemic and diuresed with Lasix.  He was tachycardic and started on Lopressor.  He was ambulating independently and felt medically stable for transfer to the telemetry unit in stable condition.  The patient continues to progress.  He is maintaining NS and his pacing wires have been removed without difficulty.  However, he has developed resting tachycardia.  Echocardiogram was obtained and showed no pericardial effusion and LVEF 35-40%. He remains on Coumadin and his INR is trending up. INR today is 1.91. Low dose Lisinopril has been added and Lopressor was changed to Toprol XL at request of cardiologist.  He  is ambulating without difficulty. He is felt surgically stable for discharge today.    Consults: cardiology  Significant Diagnostic Studies: cardiac graphics:   CTA Chest:  1. 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 Recommend cardiothoracic surgical consultation. 2. There is no associated effacement of the sino-tubular junction and no aneurysmal dilatation of the tubular portion of the ascending aorta. Similarly, the arch, descending and abdominal aorta are all normal in caliber. 3. Mild ectasia of the left common iliac artery to 1.7 cm. 4. Cardiomegaly with right heart enlargement. 5. Enlargement of the pulmonary trunk suggesting underlying pulmonary arterial hypertension.  Echocardiogram:   - Left ventricle: The cavity size was normal. Wall thickness was normal. Systolic function was normal. The estimated ejection fraction was in the range of 50% to 55%. The study is not technically sufficient to allow evaluation of LV diastolic function. - Aortic valve: Uncertain number of leaflets, cannot rule out AV bicuspid valve. There was severe regurgitation. The AR vena contract is 0.8 cm. Valve area (VTI): 3.31 cm^2. Valve area (Vmax): 3.11 cm^2. Valve area (Vmean): 2.83 cm^2. - Aorta: Aortic root dimension: 75 mm (ED). - Aortic root: The aortic root was severely dilated. The dilatation is primarily of the right Sinus of valsalva. The sinotubular junction, proximal ascending aorta, and aortic arch are poorly visualized, cannot evaluate if aneurysmal. - Mitral valve: There was mild regurgitation. - Left atrium: The atrium was severely dilated. - Right ventricle: The cavity size was mildly dilated. - Right atrium: The atrium was moderately dilated. - Atrial septum: No defect or patent foramen ovale was identified. - Pulmonary arteries: Systolic pressure was moderately increased. PA peak pressure: 50 mm Hg (S).  Catheterization: HEMODYNAMICS: Aortic pressure 109/59 mmHg; LV pressure 105/10 mmHg; LVEDP 23 mmHg; RA 8 mmHg; RV 46/11 mmHg; PA 50/23 mmHg; PCWP(mean) 23 mmHg; Cardiac Output 6.24 L/m; AV gradient none  ANGIOGRAPHIC  DATA: The left main coronary artery is normal.  The left anterior descending artery is trans apical and widely patent. There are mid and distal luminal irregularities and there is some systolic compression in the mid LAD suggesting possible intramyocardial course. 2 large diagonals arise from the proximal to mid LAD.  The left circumflex artery is large and widely patent. The circumflex is dominant..  The right coronary artery is nondominant. No obstruction is noted..  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 There is accompanying 3+ aortic regurgitation. There also appears to be mild enlargement of the ascending aorta.  LEFT VENTRICULOGRAM: Determined by regurgitation from aorta during aortography) in the 20 RAO projection and revealed of normal to mildly dilated LV cavity size with ejection fraction of 45-50%.  Treatments: surgery:   1. Median Sternotomy 2. Extracorporeal circulation 3. Replacement of aortic valve and aortic root with a 27 mm St. Jude mechanical valved conduit with reimplantation of the coronary arteries Sales executive Procedure)  Disposition: Home  Discharge Medications:   Medication List    STOP taking these medications        cefTRIAXone 250 MG injection  Commonly known as:  ROCEPHIN     dextromethorphan-guaiFENesin 30-600 MG per 12 hr tablet  Commonly known as:  MUCINEX DM     guaiFENesin-codeine 100-10 MG/5ML syrup  Commonly known as:  ROBITUSSIN AC     levofloxacin 500  MG tablet  Commonly known as:  LEVAQUIN      TAKE these medications        hydrocortisone cream 1 %  Apply topically 2 (two) times daily.     lisinopril 2.5 MG tablet  Commonly known as:  PRINIVIL,ZESTRIL  Take 1 tablet (2.5 mg total) by mouth daily.     metoprolol succinate 100 MG 24 hr tablet  Commonly known as:  TOPROL XL  Take 1 tablet (100 mg total) by mouth daily. Take with or immediately following a meal.      oxyCODONE 5 MG immediate release tablet  Commonly known as:  Oxy IR/ROXICODONE  Take 1-2 tablets (5-10 mg total) by mouth every 4 (four) hours as needed for severe pain.     warfarin 5 MG tablet  Commonly known as:  COUMADIN  Take 1 tablet (5 mg total) by mouth daily at 6 PM. Or as directed.       The patient has been discharged on:   1.Beta Blocker:  Yes [ x  ]                              No   [   ]                              If No, reason:  2.Ace Inhibitor/ARB: Yes [ x  ]                                     No  [    ]                                     If No, reason:  3.Statin:   Yes [  ]                  No  [ x  ]                  If No, reason: No CAD  4.Marlowe Kays:  Yes  [ x  ]                  No   [   ]                  If No, reason:      Discharge Instructions    Amb Referral to Cardiac Rehabilitation    Complete by:  As directed   Referring to [facility] Phase 2            Medication List    STOP taking these medications        cefTRIAXone 250 MG injection  Commonly known as:  ROCEPHIN     dextromethorphan-guaiFENesin 30-600 MG per 12 hr tablet  Commonly known as:  MUCINEX DM     guaiFENesin-codeine 100-10 MG/5ML syrup  Commonly known as:  ROBITUSSIN AC     levofloxacin 500 MG tablet  Commonly known as:  LEVAQUIN      TAKE these medications        hydrocortisone cream 1 %  Apply topically 2 (two) times daily.     lisinopril 2.5 MG tablet  Commonly known as:  PRINIVIL,ZESTRIL  Take 1 tablet (2.5 mg total) by mouth daily.     metoprolol succinate 100 MG 24 hr tablet  Commonly known as:  TOPROL XL  Take 1 tablet (100 mg total) by mouth daily. Take with or immediately following a meal.     oxyCODONE 5 MG immediate release tablet  Commonly known as:  Oxy IR/ROXICODONE  Take 1-2 tablets (5-10 mg total) by mouth every 4 (four) hours as needed for severe pain.     warfarin 5 MG tablet  Commonly known as:  COUMADIN  Take 1 tablet (5 mg total)  by mouth daily at 6 PM. Or as directed.       Follow-up Information    Follow up with [name],[name], MD On 05/16/2014.   Specialty:  Cardiothoracic Surgery   Why:  PA/LAT CX[facility] taken ([facility] which is in the same building as Dr. Sharee PimpleBartle's office) on 05/16/2014 at 10:30 am;Appointment with Dr. Laneta SimmersBartle is at 11:30   Contact information:   [address] [place] [address] [phone]       Follow up with Donato SchultzSKAINS, MARK, MD.   Specialty:  Cardiology   Why:  Call to have PT and INR (as is on Couamdin) drawn on Tuesday 04/24/2014   Contact information:   [address] [place] [address] [phone]       Follow up with Donato SchultzSKAINS, MARK, MD.   Specialty:  Cardiology   Why:  Call for a follow up appointment for 2 weeks   Contact information:   [address] [place] [phone]       Signed: Ardelle BallsZIMMERMAN,Adilee Lemme M PA-C 04/22/2014, 11:11 AM

## 2014-04-17 NOTE — Progress Notes (Signed)
1455 Cardiac Rehab Pt has walked independently in hall twice today. He denies any difficulty. We will follow pt tomorrow. Beatrix FettersHughes, Dajahnae Vondra G, RN 04/17/2014 3:18 PM

## 2014-04-18 DIAGNOSIS — R000 Tachycardia, unspecified: Secondary | ICD-10-CM

## 2014-04-18 LAB — CBC
HCT: 40 % (ref 39.0–52.0)
Hemoglobin: 13.9 g/dL (ref 13.0–17.0)
MCH: 32.5 pg (ref 26.0–34.0)
MCHC: 34.8 g/dL (ref 30.0–36.0)
MCV: 93.5 fL (ref 78.0–100.0)
Platelets: 213 10*3/uL (ref 150–400)
RBC: 4.28 MIL/uL (ref 4.22–5.81)
RDW: 13.2 % (ref 11.5–15.5)
WBC: 11 10*3/uL — AB (ref 4.0–10.5)

## 2014-04-18 LAB — PROTIME-INR
INR: 1.16 (ref 0.00–1.49)
Prothrombin Time: 14.9 seconds (ref 11.6–15.2)

## 2014-04-18 MED ORDER — WARFARIN SODIUM 7.5 MG PO TABS
7.5000 mg | ORAL_TABLET | Freq: Once | ORAL | Status: AC
Start: 2014-04-18 — End: 2014-04-18
  Administered 2014-04-18: 7.5 mg via ORAL
  Filled 2014-04-18: qty 1

## 2014-04-18 MED ORDER — METOPROLOL TARTRATE 50 MG PO TABS
50.0000 mg | ORAL_TABLET | Freq: Two times a day (BID) | ORAL | Status: DC
  Administered 2014-04-18 – 2014-04-22 (×9): 50 mg via ORAL
  Filled 2014-04-18 (×10): qty 1

## 2014-04-18 MED FILL — Potassium Chloride Inj 2 mEq/ML: INTRAVENOUS | Qty: 40 | Status: AC

## 2014-04-18 MED FILL — Dexmedetomidine HCl in NaCl 0.9% IV Soln 400 MCG/100ML: INTRAVENOUS | Qty: 100 | Status: AC

## 2014-04-18 MED FILL — Magnesium Sulfate Inj 50%: INTRAMUSCULAR | Qty: 10 | Status: AC

## 2014-04-18 MED FILL — Heparin Sodium (Porcine) Inj 1000 Unit/ML: INTRAMUSCULAR | Qty: 30 | Status: AC

## 2014-04-18 NOTE — Progress Notes (Signed)
    Subjective:   Post op Bentall. Tachy. No new complaints.   Objective:  Vital Signs in the last 24 hours: Temp:  [98.2 F (36.8 C)-98.6 F (37 C)] 98.6 F (37 C) (01/13 0430) Pulse Rate:  [105-110] 110 (01/13 0430) Resp:  [18-20] 18 (01/13 0430) BP: (115-128)/(78-81) 115/78 mmHg (01/13 0430) SpO2:  [93 %-96 %] 93 % (01/13 0430) Weight:  [192 lb 10.9 oz (87.4 kg)] 192 lb 10.9 oz (87.4 kg) (01/13 0430)  Intake/Output from previous day: 01/12 0701 - 01/13 0700 In: 840 [P.O.:840] Out: 1300 [Urine:1300]   Physical Exam: General: Well developed, well nourished, in no acute distress. Head:  Normocephalic and atraumatic. Lungs: Clear to auscultation and percussion. Heart: Normal S1 and S2, tachy  No murmur, rubs or gallops. Scar healing Abdomen: soft, non-tender, positive bowel sounds. Extremities: No clubbing or cyanosis. No edema. Neurologic: Alert and oriented x 3.    Lab Results:  Recent Labs  04/16/14 0220 04/18/14 0351  WBC 17.7* 11.0*  HGB 13.5 13.9  PLT 135* 213    Recent Labs  04/16/14 0220  NA 130*  K 4.0  CL 99  CO2 27  GLUCOSE 126*  BUN 20  CREATININE 0.73   Telemetry: Sinus tachy Personally viewed.   Scheduled Meds: . antiseptic oral rinse  7 mL Mouth Rinse BID  . docusate sodium  200 mg Oral Daily  . furosemide  40 mg Oral Daily  . metoprolol tartrate  50 mg Oral BID  . moving right along book   Does not apply Once  . pantoprazole  40 mg Oral QAC breakfast  . potassium chloride  20 mEq Oral BID  . sodium chloride  3 mL Intravenous Q12H  . warfarin  7.5 mg Oral ONCE-1800  . Warfarin - Physician Dosing Inpatient   Does not apply q1800   Continuous Infusions:  PRN Meds:.sodium chloride, acetaminophen, bisacodyl **OR** bisacodyl, lactulose, magnesium hydroxide, ondansetron **OR** ondansetron (ZOFRAN) IV, oxyCODONE, sodium chloride, traMADol  Assessment/Plan:  Principal Problem:   Aortic root dilatation Active Problems:   Pleural  effusion   Cardiomegaly   Pneumonia   Severe aortic regurgitation   Obesity (BMI 30-39.9)   SOB (shortness of breath)   S/P AVR   S/P AVR (aortic valve replacement)  Tachycardia  - agree with increased metoprolol  - consider repeat ECHO for EF however no significant dyspnea with exertion.   Mechanical AV  - coumadin, INR goal 2-3  - lifelong dental abx proph  Would like to see [facility] in [place].   Vincent [name] 04/18/2014, 8:32 AM

## 2014-04-18 NOTE — Progress Notes (Signed)
04/17/2014 08:30 PM The patient ambulated in the hallway with a walker and family of about 350 feet.  Patient tolerated the activity well.  Will continue to monitor the patient. Harriet Massonavidson, Raybon Conard E

## 2014-04-18 NOTE — Progress Notes (Signed)
1115-1140 Pt walking independently with steady gait. Walked 4 times yesterday. Education completed with pt who voiced understanding. Reviewed IS and sternal precautions, heart healthy diet, and ex ed. Pt has watched videos on Coumadin and post op OHS in preparation for discharge. Discussed CRP 2 and pt gave permission [facility]. Pt feels comfortable walking with wife and states she likes to walk with him so we will sign off. Luetta NuttingCharlene Cas Tracz RN BSN 04/18/2014 11:42 AM

## 2014-04-18 NOTE — Progress Notes (Signed)
04/18/2014 1:16 PM  EPW d/c'd per order and protocol.  Wire tips intact.  Instructed pt. On bedrest x1 hr and frequent vital signs.  Pt. Tolerated well. Kathryne HitchAllen, Kathlen Sakurai C

## 2014-04-18 NOTE — Progress Notes (Addendum)
       [address]       [place], [id number]             [phone]          5 Days Post-Op Procedure(s) (LRB): BENTALL PROCEDURE (N/A) TRANSESOPHAGEAL ECHOCARDIOGRAM (TEE) (N/A)  Subjective: Feels well.  Bowels working. Walked x 4 yesterday.  No complaints.  Objective: Vital signs in last 24 hours: Patient Vitals for the past 24 hrs:  BP Temp Temp src Pulse Resp SpO2 Weight  04/18/14 0430 115/78 mmHg 98.6 F (37 C) Oral (!) 110 18 93 % 192 lb 10.9 oz (87.4 kg)  04/17/14 2111 122/79 mmHg 98.5 F (36.9 C) Oral (!) 109 18 96 % -  04/17/14 1309 119/79 mmHg 98.2 F (36.8 C) Oral (!) 105 20 95 % -  04/17/14 1001 128/81 mmHg 98.6 F (37 C) Oral (!) 109 18 96 % -   Current Weight  04/18/14 192 lb 10.9 oz (87.4 kg)  PRE-OPERATIVE WEIGHT: 88 kg   Intake/Output from previous day: 01/12 0701 - 01/13 0700 In: 840 [P.O.:840] Out: 1300 [Urine:1300]    PHYSICAL EXAM:  Heart: RRR, good valve click Lungs: Clear Wound: Clean and dry Extremities: Trace LE edema    Lab Results: CBC: Recent Labs  04/16/14 0220 04/18/14 0351  WBC 17.7* 11.0*  HGB 13.5 13.9  HCT 39.3 40.0  PLT 135* 213   BMET:  Recent Labs  04/16/14 0220  NA 130*  K 4.0  CL 99  CO2 27  GLUCOSE 126*  BUN 20  CREATININE 0.73  CALCIUM 7.9*    PT/INR:  Recent Labs  04/18/14 0351  LABPROT 14.9  INR 1.16      Assessment/Plan: S/P Procedure(s) (LRB): BENTALL PROCEDURE (N/A) TRANSESOPHAGEAL ECHOCARDIOGRAM (TEE) (N/A)  CV- Still mildly tachy at rest, BPs stable. Will increase Lopressor a bit more and watch. Continue Coumadin load for mechanical valve - little change with 5 mg daily so will give 7.5 tonight.  Vol overload- diurese.  Leukocytosis- WBC back to baseline.  Continue CRPI, pulm toilet.  Home once INR starts to trend upward.   LOS: 9 days     COLLINS,GINA H 04/18/2014   Chart reviewed, patient examined, agree with above.

## 2014-04-18 NOTE — Discharge Instructions (Addendum)
Aortic Valve Replacement, Care After °Refer to this sheet in the next few weeks. These instructions provide you with information on caring for yourself after your procedure. Your health care provider may also give you specific instructions. Your treatment has been planned according to current medical practices, but problems sometimes occur. Call your health care provider if you have any problems or questions after your procedure. °HOME CARE INSTRUCTIONS  °· Take medicines only as directed by your health care provider. °· If your health care provider has prescribed elastic stockings, wear them as directed. °· Take frequent naps or rest often throughout the day. °· Avoid lifting over 10 lbs (4.5 kg) or pushing or pulling things with your arms for 6-8 weeks or as directed by your health care provider. °· Avoid driving or airplane travel for 4-6 weeks after surgery or as directed by your health care provider. If you are riding in a car for an extended period, stop every 1-2 hours to stretch your legs. Keep a record of your medicines and medical history with you when traveling. °· Do not drive or operate heavy machinery while taking pain medicine. (narcotics). °· Do not cross your legs. °· Do not use any tobacco products including cigarettes, chewing tobacco, or electronic cigarettes. If you need help quitting, ask your health care provider. °· Do not take baths, swim, or use a hot tub until your health care provider approves. Take showers once your health care provider approves. Pat incisions dry. Do not rub incisions with a washcloth or towel. °· Avoid climbing stairs and using the handrail to pull yourself up for the first 2-3 weeks after surgery. °· Return to work as directed by your health care provider. °· Drink enough fluid to keep your urine clear or pale yellow. °· Do not strain to have a bowel movement. Eat high-fiber foods if you become constipated. You may also take a medicine to help you have a bowel  movement (laxative) as directed by your health care provider. °· Resume sexual activity as directed by your health care provider. Men should not use medicines for erectile dysfunction until their doctor says it is okay. °· If you had a certain type of heart condition in the past, you may need to take antibiotic medicine before having dental work or surgery. Let your dentist and health care providers know if you had one or more of the following: °· Previous endocarditis. °· An artificial (prosthetic) heart valve. °· Congenital heart disease. °SEEK MEDICAL CARE IF: °· You develop a skin rash.   °· You experience sudden changes in your weight. °· You have a fever. °SEEK IMMEDIATE MEDICAL CARE IF:  °· You develop chest pain that is not coming from your incision. °· You have drainage (pus), redness, swelling, or pain at your incision site.   °· You develop shortness of breath or have difficulty breathing.   °· You have increased bleeding from your incision site.   °· You develop light-headedness.   °MAKE SURE YOU:  °· Understand these directions. °· Will watch your condition. °· Will get help right away if you are not doing well or get worse. °Document Released: 10/09/2004 Document Revised: 08/07/2013 Document Reviewed: 01/05/2012 °ExitCare® Patient Information ©2015 [employer]. This information is not intended to replace advice given to you by your health care provider. Make sure you discuss any questions you have with your health care provider. ° °Vitamin K Foods and Warfarin °Warfarin is a medicine that helps prevent harmful blood clots by causing blood to clot more   slowly. It does this by decreasing the activity of vitamin K, which promotes normal blood clotting. For the dose of warfarin you have been prescribed to work well, you need to get about the same amount of vitamin K from your food from day to day. Suddenly getting a lot more vitamin K could cause your blood to clot too quickly. A sudden decrease in  vitamin K intake could cause your blood to clot too slowly. These changes in vitamin K intake could lead to dangerous blood clotsor to bleeding. WHAT GENERAL GUIDELINES DO I NEED TO FOLLOW?  Keep your intake of vitamin K consistent from day to day. To do this, you must be aware of which foods contain moderate or high amounts of vitamin K. Listed below are some foods that are very high, high, or moderately high in vitamin K. If you eat these foods, make sure you eat a consistent amount of them from day to day.  Avoid major changes in your diet, or tell your health care provider before changing your diet.  If you take a multivitamin that contains vitamin K, be sure to take it every day.  If you drink green tea, drink the same amount each day. WHAT FOODS ARE VERY HIGH IN VITAMIN K?   Greens, such as Swiss chard and beet, collard, mustard, or turnip greens (fresh or frozen, cooked).  Kale (fresh or frozen, cooked).   Parsley (raw).  Spinach (cooked).  WHAT FOODS ARE HIGH IN VITAMIN K?  Asparagus (frozen, cooked).  Beans, green (frozen, cooked).  Broccoli.   Bok choy (cooked).   Brussels sprouts (fresh or frozen, cooked).  Cabbage (cooked).  Coleslaw. WHAT FOODS ARE MODERATELY HIGH IN VITAMIN K?  Blueberries.  Black-eyed peas.  Endive (raw).   Green leaf lettuce (raw).   Green scallions (raw).  Kale (raw).  Okra (frozen, cooked).  Plantains (fried).  Romaine lettuce (raw).   Sauerkraut (canned).   Spinach (raw). Document Released: 01/18/2009 Document Revised: 03/28/2013 Document Reviewed: 01/25/2013 [facility] Patient Information 2015 [place], [place]. This information is not intended to replace advice given to you by your health care provider. Make sure you discuss any questions you have with your health care provider.  Information on my medicine - Coumadin   (Warfarin)  This medication education was reviewed with me or my healthcare representative  as part of my discharge preparation.  The pharmacist that spoke with me during my hospital stay was:  Severiano GilbertWilson, Frank Rhea, [facility] CenterRPH  Why was Coumadin prescribed for you? Coumadin was prescribed for you because you have a blood clot or a medical condition that can cause an increased risk of forming blood clots. Blood clots can cause serious health problems by blocking the flow of blood to the heart, lung, or brain. Coumadin can prevent harmful blood clots from forming. As a reminder your indication for Coumadin is:   Blood Clot Prevention After Heart Valve Surgery  What test will check on my response to Coumadin? While on Coumadin (warfarin) you will need to have an INR test regularly to ensure that your dose is keeping you in the desired range. The INR (international normalized ratio) number is calculated from the result of the laboratory test called prothrombin time (PT).  If an INR APPOINTMENT HAS NOT ALREADY BEEN MADE FOR YOU please schedule an appointment to have this lab work done by your health care provider within 7 days. Your INR goal is usually a number between:  2 to 3 or your provider may  give you a more narrow range like 2-2.5.  Ask your health care provider during an office visit what your goal INR is.  What  do you need to  know  About  COUMADIN? Take Coumadin (warfarin) exactly as prescribed by your healthcare provider about the same time each day.  DO NOT stop taking without talking to the doctor who prescribed the medication.  Stopping without other blood clot prevention medication to take the place of Coumadin may increase your risk of developing a new clot or stroke.  Get refills before you run out.  What do you do if you miss a dose? If you miss a dose, take it as soon as you remember on the same day then continue your regularly scheduled regimen the next day.  Do not take two doses of Coumadin at the same time.  Important Safety Information A possible side effect of Coumadin  (Warfarin) is an increased risk of bleeding. You should call your healthcare provider right away if you experience any of the following: ? Bleeding from an injury or your nose that does not stop. ? Unusual colored urine (red or dark brown) or unusual colored stools (red or black). ? Unusual bruising for unknown reasons. ? A serious fall or if you hit your head (even if there is no bleeding).  Some foods or medicines interact with Coumadin (warfarin) and might alter your response to warfarin. To help avoid this: ? Eat a balanced diet, maintaining a consistent amount of Vitamin K. ? Notify your provider about major diet changes you plan to make. ? Avoid alcohol or limit your intake to 1 drink for women and 2 drinks for men per day. (1 drink is 5 oz. wine, 12 oz. beer, or 1.5 oz. liquor.)  Make sure that ANY health care provider who prescribes medication for you knows that you are taking Coumadin (warfarin).  Also make sure the healthcare provider who is monitoring your Coumadin knows when you have started a new medication including herbals and non-prescription products.  Coumadin (Warfarin)  Major Drug Interactions  Increased Warfarin Effect Decreased Warfarin Effect  Alcohol (large quantities) Antibiotics (esp. Septra/Bactrim, Flagyl, Cipro) Amiodarone (Cordarone) Aspirin (ASA) Cimetidine (Tagamet) Megestrol (Megace) NSAIDs (ibuprofen, naproxen, etc.) Piroxicam (Feldene) Propafenone (Rythmol SR) Propranolol (Inderal) Isoniazid (INH) Posaconazole (Noxafil) Barbiturates (Phenobarbital) Carbamazepine (Tegretol) Chlordiazepoxide (Librium) Cholestyramine (Questran) Griseofulvin Oral Contraceptives Rifampin Sucralfate (Carafate) Vitamin K   Coumadin (Warfarin) Major Herbal Interactions  Increased Warfarin Effect Decreased Warfarin Effect  Garlic Ginseng Ginkgo biloba Coenzyme Q10 Green tea St. Johns wort    Coumadin (Warfarin) FOOD Interactions  Eat a consistent number of  servings per week of foods HIGH in Vitamin K (1 serving =  cup)  Collards (cooked, or boiled & drained) Kale (cooked, or boiled & drained) Mustard greens (cooked, or boiled & drained) Parsley *serving size only =  cup Spinach (cooked, or boiled & drained) Swiss chard (cooked, or boiled & drained) Turnip greens (cooked, or boiled & drained)  Eat a consistent number of servings per week of foods MEDIUM-HIGH in Vitamin K (1 serving = 1 cup)  Asparagus (cooked, or boiled & drained) Broccoli (cooked, boiled & drained, or raw & chopped) Brussel sprouts (cooked, or boiled & drained) *serving size only =  cup Lettuce, raw (green leaf, endive, romaine) Spinach, raw Turnip greens, raw & chopped   These websites have more information on Coumadin (warfarin):  http://www.king-russell.com/; https://www.hines.net/;

## 2014-04-19 DIAGNOSIS — R000 Tachycardia, unspecified: Secondary | ICD-10-CM | POA: Diagnosis present

## 2014-04-19 LAB — PROTIME-INR
INR: 1.37 (ref 0.00–1.49)
PROTHROMBIN TIME: 17 s — AB (ref 11.6–15.2)

## 2014-04-19 MED ORDER — WARFARIN SODIUM 7.5 MG PO TABS
7.5000 mg | ORAL_TABLET | Freq: Once | ORAL | Status: AC
  Administered 2014-04-19: 7.5 mg via ORAL
  Filled 2014-04-19: qty 1

## 2014-04-19 MED ORDER — HYDROCORTISONE 1 % EX CREA
TOPICAL_CREAM | Freq: Two times a day (BID) | CUTANEOUS | Status: DC
  Administered 2014-04-19: 22:00:00 via TOPICAL
  Administered 2014-04-19: 1 via TOPICAL
  Administered 2014-04-20 – 2014-04-21 (×4): via TOPICAL
  Filled 2014-04-19: qty 28

## 2014-04-19 NOTE — Progress Notes (Signed)
    Subjective:   Post op [place]. Tachy. No new complaints.   Objective:  Vital Signs in the last 24 hours: Temp:  [98.9 F (37.2 C)-99.8 F (37.7 C)] 98.9 F (37.2 C) (01/14 0452) Pulse Rate:  [81-112] 106 (01/14 0452) Resp:  [18-19] 18 (01/14 0452) BP: (108-124)/(72-86) 122/79 mmHg (01/14 0452) SpO2:  [94 %-98 %] 94 % (01/14 0452) Weight:  [191 lb 2.2 oz (86.7 kg)] 191 lb 2.2 oz (86.7 kg) (01/14 0452)  Intake/Output from previous day: 01/13 0701 - 01/14 0700 In: 1080 [P.O.:1080] Out: 2475 [Urine:2475]   Physical Exam: General: Well developed, well nourished, in no acute distress. Head:  Normocephalic and atraumatic. Lungs: Clear to auscultation and percussion. Heart: Normal S1 and S2, tachy  No murmur, rubs or gallops. Scar healing Abdomen: soft, non-tender, positive bowel sounds. Extremities: No clubbing or cyanosis. No edema. Neurologic: Alert and oriented x 3.    Lab Results:  Recent Labs  04/18/14 0351  WBC 11.0*  HGB 13.9  PLT 213   No results for input(s): NA, K, CL, CO2, GLUCOSE, BUN, CREATININE in the last 72 hours. Telemetry: Sinus tachy Personally viewed.   Scheduled Meds: . antiseptic oral rinse  7 mL Mouth Rinse BID  . docusate sodium  200 mg Oral Daily  . furosemide  40 mg Oral Daily  . hydrocortisone cream   Topical BID  . metoprolol tartrate  50 mg Oral BID  . moving right along book   Does not apply Once  . pantoprazole  40 mg Oral QAC breakfast  . sodium chloride  3 mL Intravenous Q12H  . warfarin  7.5 mg Oral ONCE-1800  . Warfarin - Physician Dosing Inpatient   Does not apply q1800   Continuous Infusions:  PRN Meds:.sodium chloride, acetaminophen, bisacodyl **OR** bisacodyl, lactulose, magnesium hydroxide, ondansetron **OR** ondansetron (ZOFRAN) IV, oxyCODONE, sodium chloride, traMADol  Assessment/Plan:  Principal Problem:   Aortic root dilatation Active Problems:   Pleural effusion   Cardiomegaly   Pneumonia   Severe aortic  regurgitation   Obesity (BMI 30-39.9)   SOB (shortness of breath)   S/P AVR   S/P AVR (aortic valve replacement)  Tachycardia  - improved  - agree with metoprolol  - pre op EF 50%  Mechanical AV  - coumadin, INR goal 2-3  - lifelong dental abx proph  Would like to see [facility] in [place] on DC.   SKAINS, MARK 04/19/2014, 9:13 AM

## 2014-04-19 NOTE — Progress Notes (Addendum)
       [address]       [place] [id number]             [phone]          6 Days Post-Op Procedure(s) (LRB): BENTALL PROCEDURE (N/A) TRANSESOPHAGEAL ECHOCARDIOGRAM (TEE) (N/A)  Subjective: Complaining today of pain in right 5th toe and pruritic rash on neck. Otherwise doing well.   Objective: Vital signs in last 24 hours: Patient Vitals for the past 24 hrs:  BP Temp Temp src Pulse Resp SpO2 Weight  04/19/14 0452 122/79 mmHg 98.9 F (37.2 C) Oral (!) 106 18 94 % 191 lb 2.2 oz (86.7 kg)  04/18/14 2147 124/86 mmHg 98.9 F (37.2 C) Oral (!) 112 18 98 % -  04/18/14 1500 109/72 mmHg - - (!) 103 - - -  04/18/14 1433 111/72 mmHg 99.8 F (37.7 C) Oral 100 19 94 % -  04/18/14 1350 111/72 mmHg - - 100 - - -  04/18/14 1335 119/75 mmHg - - 99 - - -  04/18/14 1331 111/78 mmHg - - (!) 103 - 94 % -  04/18/14 1320 122/81 mmHg - - 99 - 94 % -  04/18/14 1316 108/77 mmHg - - (!) 103 - 96 % -  04/18/14 1305 122/76 mmHg - - 97 - - -  04/18/14 1301 109/72 mmHg - - 81 - 96 % -  04/18/14 1028 112/77 mmHg - - (!) 107 - - -   Current Weight  04/19/14 191 lb 2.2 oz (86.7 kg)  PRE-OPERATIVE WEIGHT: 88 kg   Intake/Output from previous day: 01/13 0701 - 01/14 0700 In: 1080 [P.O.:1080] Out: 2475 [Urine:2475]    PHYSICAL EXAM: Neck: posterior neck and upper back with circumferential papular erythematous rash Heart: RRR, good valve click Lungs: Clear Wound: Clean and dry Extremities: Mild LE edema, some tenderness to palpation of right 5th toe at distal MTP joint, no erythema or swelling    Lab Results: CBC: Recent Labs  04/18/14 0351  WBC 11.0*  HGB 13.9  HCT 40.0  PLT 213   BMET: No results for input(s): NA, K, CL, CO2, GLUCOSE, BUN, CREATININE, CALCIUM in the last 72 hours.  PT/INR:  Recent Labs  04/19/14 0356  LABPROT 17.0*  INR 1.37      Assessment/Plan: S/P Procedure(s) (LRB): BENTALL PROCEDURE (N/A) TRANSESOPHAGEAL ECHOCARDIOGRAM (TEE)  (N/A)  CV- Still mildly tachy, BPs stable. Continue current Lopressor dose for now and watch.  Anticoagulation- INR starting to trend up. Continue Coumadin 7.5 mg tonight for mechanical valve.  Vol overload- diurese.  Rash on neck- area appears circumferential, likely a contact dermatitis. Will Rx hydrocortisone cream and watch.  R toe pain- no h/o gout, but this could represent a flare. Will watch.  If he develops erythema or swelling, may need to tx empirically for gout.  Overall doing well and INR starting to trend up. Home once INR around 2.0.   LOS: 10 days    COLLINS,GINA H 04/19/2014   Chart reviewed, patient examined, agree with above. His INR is starting to bump. Continue coumdin 7.5 mg. His weight is below preop. Will stop diuretics.

## 2014-04-20 LAB — PROTIME-INR
INR: 1.51 — ABNORMAL HIGH (ref 0.00–1.49)
Prothrombin Time: 18.3 seconds — ABNORMAL HIGH (ref 11.6–15.2)

## 2014-04-20 MED ORDER — WARFARIN SODIUM 7.5 MG PO TABS
7.5000 mg | ORAL_TABLET | Freq: Once | ORAL | Status: AC
  Administered 2014-04-20: 7.5 mg via ORAL
  Filled 2014-04-20: qty 1

## 2014-04-20 NOTE — Progress Notes (Addendum)
    Subjective:   Post op [place]. Tachy. No new complaints. Waking halls  Objective:  Vital Signs in the last 24 hours: Temp:  [98.2 F (36.8 C)-98.9 F (37.2 C)] 98.6 F (37 C) (01/15 0543) Pulse Rate:  [101-112] 112 (01/15 0543) Resp:  [18-19] 18 (01/15 0543) BP: (106-121)/(65-87) 121/87 mmHg (01/15 0543) SpO2:  [94 %-97 %] 94 % (01/15 0543) Weight:  [187 lb 9.8 oz (85.1 kg)] 187 lb 9.8 oz (85.1 kg) (01/15 0543)  Intake/Output from previous day: 01/14 0701 - 01/15 0700 In: 1062 [P.O.:1062] Out: 1900 [Urine:1900]   Physical Exam: General: Well developed, well nourished, in no acute distress. Head:  Normocephalic and atraumatic. Lungs: Clear to auscultation and percussion. Heart: Normal S1 and S2, tachy  No murmur, rubs or gallops. Scar healing Abdomen: soft, non-tender, positive bowel sounds. Extremities: No clubbing or cyanosis. No edema. Neurologic: Alert and oriented x 3.    Lab Results:  Recent Labs  04/18/14 0351  WBC 11.0*  HGB 13.9  PLT 213   No results for input(s): NA, K, CL, CO2, GLUCOSE, BUN, CREATININE in the last 72 hours. Telemetry: Sinus tachy Personally viewed.   Scheduled Meds: . antiseptic oral rinse  7 mL Mouth Rinse BID  . docusate sodium  200 mg Oral Daily  . hydrocortisone cream   Topical BID  . metoprolol tartrate  50 mg Oral BID  . moving right along book   Does not apply Once  . pantoprazole  40 mg Oral QAC breakfast  . sodium chloride  3 mL Intravenous Q12H  . warfarin  7.5 mg Oral ONCE-1800  . Warfarin - Physician Dosing Inpatient   Does not apply q1800   Continuous Infusions:  PRN Meds:.sodium chloride, acetaminophen, bisacodyl **OR** bisacodyl, lactulose, magnesium hydroxide, ondansetron **OR** ondansetron (ZOFRAN) IV, oxyCODONE, sodium chloride, traMADol  Assessment/Plan:  Principal Problem:   Aortic root dilatation Active Problems:   Pleural effusion   Cardiomegaly   Pneumonia   Severe aortic regurgitation   Obesity  (BMI 30-39.9)   SOB (shortness of breath)   S/P AVR   S/P AVR (aortic valve replacement)   Tachycardia  Tachycardia  - still present.  Walking hallways without difficult. No SOB. Discussed with Dr. Laneta SimmersBartle. Will order ECHO to exclude pericardial effusion. Asymptomatic.   - agree with metoprolol  - pre op EF 50%  Mechanical AV  - coumadin, INR goal 2-3, waiting  - lifelong dental abx proph  Would like to see [facility] in [place] on DC.   Rendy Lazard 04/20/2014, 8:24 AM

## 2014-04-20 NOTE — Progress Notes (Addendum)
       [address]       [place] [id number]             [phone]          7 Days Post-Op Procedure(s) (LRB): BENTALL PROCEDURE (N/A) TRANSESOPHAGEAL ECHOCARDIOGRAM (TEE) (N/A)  Subjective: Feels well, no complaints.  Rash improved and foot pain resolved.   Objective: Vital signs in last 24 hours: Patient Vitals for the past 24 hrs:  BP Temp Temp src Pulse Resp SpO2 Weight  04/20/14 0543 121/87 mmHg 98.6 F (37 C) Oral (!) 112 18 94 % 187 lb 9.8 oz (85.1 kg)  04/19/14 2035 116/78 mmHg 98.2 F (36.8 C) Oral (!) 112 18 96 % -  04/19/14 1332 106/65 mmHg 98.9 F (37.2 C) Oral (!) 101 19 97 % -  04/19/14 1035 118/75 mmHg - - (!) 105 - - -   Current Weight  04/20/14 187 lb 9.8 oz (85.1 kg)  PRE-OPERATIVE WEIGHT: 88 kg   Intake/Output from previous day: 01/14 0701 - 01/15 0700 In: 1062 [P.O.:1062] Out: 1900 [Urine:1900]    PHYSICAL EXAM:  Heart: RRR, mildly tachy, good valve click Lungs: Clear Wound: Clean and dry Extremities: No significant LE edema    Lab Results: CBC: Recent Labs  04/18/14 0351  WBC 11.0*  HGB 13.9  HCT 40.0  PLT 213   BMET: No results for input(s): NA, K, CL, CO2, GLUCOSE, BUN, CREATININE, CALCIUM in the last 72 hours.  PT/INR:  Recent Labs  04/20/14 0430  LABPROT 18.3*  INR 1.51*      Assessment/Plan: S/P Procedure(s) (LRB): BENTALL PROCEDURE (N/A) TRANSESOPHAGEAL ECHOCARDIOGRAM (TEE) (N/A)  CV- Still mildly tachy, BPs stable. Continue current Lopressor dose for now and watch.  Anticoagulation- INR slowly trending up. Continue Coumadin 7.5 mg for mechanical valve.  Continue CRPI, pulm toilet.  Home once INR closer to 2.0, hopefully this weekend.   LOS: 11 days    COLLINS,GINA H 04/20/2014   Chart reviewed, patient examined, agree with above. He feels well and is walking well with no shortness of breath. He still has a resting tachycardia on Lopressor with a normal HCT. Will check an echo to  be sure he does not have a significant pericardial effusion contributing to tachycardia. When he goes home he will need INR checked early next week in [place] with follow up there.

## 2014-04-21 DIAGNOSIS — R0602 Shortness of breath: Secondary | ICD-10-CM

## 2014-04-21 LAB — PROTIME-INR
INR: 1.68 — AB (ref 0.00–1.49)
Prothrombin Time: 20 seconds — ABNORMAL HIGH (ref 11.6–15.2)

## 2014-04-21 MED ORDER — WARFARIN SODIUM 7.5 MG PO TABS
7.5000 mg | ORAL_TABLET | Freq: Every day | ORAL | Status: DC
  Administered 2014-04-21: 7.5 mg via ORAL
  Filled 2014-04-21 (×2): qty 1

## 2014-04-21 NOTE — Progress Notes (Signed)
    Subjective:   Post op [place]. Tachy. No new complaints. Waking halls  Objective:  Vital Signs in the last 24 hours: Temp:  [98.4 F (36.9 C)-99 F (37.2 C)] 98.5 F (36.9 C) (01/16 0514) Pulse Rate:  [96-109] 98 (01/16 0514) Resp:  [19-20] 20 (01/16 0514) BP: (111-122)/(77-95) 111/77 mmHg (01/16 0514) SpO2:  [95 %-98 %] 95 % (01/16 0514) Weight:  [186 lb 8.2 oz (84.6 kg)] 186 lb 8.2 oz (84.6 kg) (01/16 0514)  Intake/Output from previous day: 01/15 0701 - 01/16 0700 In: 720 [P.O.:720] Out: 1700 [Urine:1700]   Physical Exam: General: Well developed, well nourished, in no acute distress. Head:  Normocephalic and atraumatic. Lungs: Clear to auscultation and percussion. Heart: Normal S1 and S2, tachy  No murmur, rubs or gallops. Scar healing Abdomen: soft, non-tender, positive bowel sounds. Extremities: No clubbing or cyanosis. No edema. Neurologic: Alert and oriented x 3.    Telemetry: Sinus tachy Personally viewed.   Scheduled Meds: . antiseptic oral rinse  7 mL Mouth Rinse BID  . docusate sodium  200 mg Oral Daily  . hydrocortisone cream   Topical BID  . metoprolol tartrate  50 mg Oral BID  . moving right along book   Does not apply Once  . pantoprazole  40 mg Oral QAC breakfast  . sodium chloride  3 mL Intravenous Q12H  . warfarin  7.5 mg Oral q1800  . Warfarin - Physician Dosing Inpatient   Does not apply q1800   Continuous Infusions:  PRN Meds:.sodium chloride, acetaminophen, bisacodyl **OR** bisacodyl, lactulose, magnesium hydroxide, ondansetron **OR** ondansetron (ZOFRAN) IV, oxyCODONE, sodium chloride, traMADol  Assessment/Plan:  Principal Problem:   Aortic root dilatation Active Problems:   Pleural effusion   Cardiomegaly   Pneumonia   Severe aortic regurgitation   Obesity (BMI 30-39.9)   SOB (shortness of breath)   S/P AVR   S/P AVR (aortic valve replacement)   Tachycardia  Tachycardia  - still present but improved.  Walking hallways without  difficult. No SOB. Discussed with Dr. Laneta SimmersBartle.   - await ECHO to exclude pericardial effusion. Asymptomatic.   - agree with metoprolol  - pre op EF 50%  Mechanical AV  - coumadin, INR goal 2-3, waiting  - lifelong dental abx proph  Would like to see [facility] in [place] on DC.   SKAINS, MARK 04/21/2014, 9:26 AM

## 2014-04-21 NOTE — Progress Notes (Addendum)
      [address]       ,[place] [id number]             [phone]        8 Days Post-Op Procedure(s) (LRB): BENTALL PROCEDURE (N/A) TRANSESOPHAGEAL ECHOCARDIOGRAM (TEE) (N/A)  Subjective: Patient eating breakfast. No complaints.  Objective: Vital signs in last 24 hours: Temp:  [98.4 F (36.9 C)-99 F (37.2 C)] 98.5 F (36.9 C) (01/16 0514) Pulse Rate:  [96-109] 98 (01/16 0514) Cardiac Rhythm:  [-]  Resp:  [19-20] 20 (01/16 0514) BP: (111-140)/(77-95) 111/77 mmHg (01/16 0514) SpO2:  [95 %-98 %] 95 % (01/16 0514) Weight:  [186 lb 8.2 oz (84.6 kg)] 186 lb 8.2 oz (84.6 kg) (01/16 0514)  Pre op weight 88 kg Current Weight  04/21/14 186 lb 8.2 oz (84.6 kg)      Intake/Output from previous day: 01/15 0701 - 01/16 0700 In: 720 [P.O.:720] Out: 1700 [Urine:1700]   Physical Exam:  Cardiovascular: Slightly tachy, sharp valve click Pulmonary: Clear to auscultation bilaterally; no rales, wheezes, or rhonchi. Abdomen: Soft, non tender, bowel sounds present. Extremities: Mild bilateral lower extremity edema. Wounds: Clean and dry.  No erythema or signs of infection.  Lab Results: CBC:No results for input(s): WBC, HGB, HCT, PLT in the last 72 hours. BMET: No results for input(s): NA, K, CL, CO2, GLUCOSE, BUN, CREATININE, CALCIUM in the last 72 hours.  PT/INR:  Lab Results  Component Value Date   INR 1.68* 04/21/2014   INR 1.51* 04/20/2014   INR 1.37 04/19/2014   ABG:  INR: Will add last result for INR, ABG once components are confirmed Will add last 4 CBG results once components are confirmed  Assessment/Plan:  1. CV - ST in the low 100's this am. On Lopressor 50 bid and Coumadin. INR up from 1.51 to 1.68. Echo ordered yesterday to rule out pericardial effusion, but not done yet. 2.  Pulmonary - On room air.Encourage incentive spirometer 3. Home when INR closer to 2   ZIMMERMAN,DONIELLE MPA-C 04/21/2014,7:43 AM  Echocardiogram personally  reviewed prob DC in am  patient examined and medical record reviewed,agree with above note. VAN TRIGT III,Ishani Goldwasser 04/21/2014

## 2014-04-22 LAB — PROTIME-INR
INR: 1.91 — ABNORMAL HIGH (ref 0.00–1.49)
Prothrombin Time: 22.1 seconds — ABNORMAL HIGH (ref 11.6–15.2)

## 2014-04-22 MED ORDER — OXYCODONE HCL 5 MG PO TABS: 5.0000 mg | ORAL_TABLET | ORAL | Status: DC | PRN

## 2014-04-22 MED ORDER — LISINOPRIL 2.5 MG PO TABS: 2.5000 mg | ORAL_TABLET | Freq: Every day | ORAL | Status: DC

## 2014-04-22 MED ORDER — METOPROLOL TARTRATE 50 MG PO TABS: 50.0000 mg | ORAL_TABLET | Freq: Two times a day (BID) | ORAL | Status: DC

## 2014-04-22 MED ORDER — METOPROLOL SUCCINATE ER 100 MG PO TB24: 100.0000 mg | ORAL_TABLET | Freq: Every day | ORAL | Status: DC

## 2014-04-22 MED ORDER — HYDROCORTISONE 1 % EX CREA: TOPICAL_CREAM | Freq: Two times a day (BID) | CUTANEOUS | Status: DC

## 2014-04-22 MED ORDER — WARFARIN SODIUM 5 MG PO TABS: 5.0000 mg | ORAL_TABLET | Freq: Every day | ORAL | Status: DC

## 2014-04-22 NOTE — Progress Notes (Signed)
    Subjective:   Post op Bentall. Tachy. No new complaints. Waking halls  Objective:  Vital Signs in the last 24 hours: Temp:  [97.9 F (36.6 C)-99.7 F (37.6 C)] 97.9 F (36.6 C) (01/17 0528) Pulse Rate:  [94-107] 107 (01/17 0528) Resp:  [18] 18 (01/17 0528) BP: (99-124)/(62-79) 124/79 mmHg (01/17 0528) SpO2:  [94 %-98 %] 95 % (01/17 0528) Weight:  [185 lb 6.4 oz (84.097 kg)] 185 lb 6.4 oz (84.097 kg) (01/17 0528)  Intake/Output from previous day: 01/16 0701 - 01/17 0700 In: -  Out: 800 [Urine:800]   Physical Exam: General: Well developed, well nourished, in no acute distress. Head:  Normocephalic and atraumatic. Lungs: Clear to auscultation and percussion. Heart: Normal S1 and S2, tachy  No murmur, rubs or gallops. Scar healing Abdomen: soft, non-tender, positive bowel sounds. Extremities: No clubbing or cyanosis. No edema. Neurologic: Alert and oriented x 3.    Telemetry: Sinus tachy Personally viewed.   Scheduled Meds: . antiseptic oral rinse  7 mL Mouth Rinse BID  . docusate sodium  200 mg Oral Daily  . hydrocortisone cream   Topical BID  . metoprolol tartrate  50 mg Oral BID  . moving right along book   Does not apply Once  . pantoprazole  40 mg Oral QAC breakfast  . sodium chloride  3 mL Intravenous Q12H  . warfarin  7.5 mg Oral q1800  . Warfarin - Physician Dosing Inpatient   Does not apply q1800   Continuous Infusions:  PRN Meds:.sodium chloride, acetaminophen, bisacodyl **OR** bisacodyl, lactulose, magnesium hydroxide, ondansetron **OR** ondansetron (ZOFRAN) IV, oxyCODONE, sodium chloride, traMADol  Assessment/Plan:  Principal Problem:   Aortic root dilatation Active Problems:   Pleural effusion   Cardiomegaly   Pneumonia   Severe aortic regurgitation   Obesity (BMI 30-39.9)   SOB (shortness of breath)   S/P AVR   S/P AVR (aortic valve replacement)   Tachycardia  Tachycardia  -  improved.  Walking hallways without difficult. No SOB.  Discussed with Dr. Laneta SimmersBartle.   - ECHO shows no pericardial effusion. Asymptomatic.   - agree with metoprolol  - pre op EF 50% current EF 35-40%  - Toprol 100 and new dose of lisinopril 2.5   - Will likely repeat ECHO in 3 months. Should improve with time post op  Mechanical AV  - coumadin, INR goal 2-3, waiting. OK for DC at 1.9  - lifelong dental abx proph  Would like to see [facility] in [place] on DC.   Ok for [employer]  Kiernan Atkerson, [place] 04/22/2014, 9:28 AM

## 2014-04-22 NOTE — Progress Notes (Addendum)
      [address]       [place] [id number]             [phone]        9 Days Post-Op Procedure(s) (LRB): BENTALL PROCEDURE (N/A) TRANSESOPHAGEAL ECHOCARDIOGRAM (TEE) (N/A)  Subjective: Patient eating breakfast. No complaints.  Objective: Vital signs in last 24 hours: Temp:  [97.9 F (36.6 C)-99.7 F (37.6 C)] 97.9 F (36.6 C) (01/17 0528) Pulse Rate:  [94-107] 107 (01/17 0528) Cardiac Rhythm:  [-] Sinus tachycardia (01/16 1940) Resp:  [18] 18 (01/17 0528) BP: (99-124)/(62-79) 124/79 mmHg (01/17 0528) SpO2:  [94 %-98 %] 95 % (01/17 0528) Weight:  [185 lb 6.4 oz (84.097 kg)] 185 lb 6.4 oz (84.097 kg) (01/17 0528)  Pre op weight 88 kg Current Weight  04/22/14 185 lb 6.4 oz (84.097 kg)      Intake/Output from previous day: 01/16 0701 - 01/17 0700 In: -  Out: 800 [Urine:800]   Physical Exam:  Cardiovascular: Slightly tachy, sharp valve click Pulmonary: Clear to auscultation bilaterally; no rales, wheezes, or rhonchi. Abdomen: Soft, non tender, bowel sounds present. Extremities: Mild bilateral lower extremity edema. Wounds: Clean and dry.  No erythema or signs of infection.  Lab Results: CBC:No results for input(s): WBC, HGB, HCT, PLT in the last 72 hours. BMET: No results for input(s): NA, K, CL, CO2, GLUCOSE, BUN, CREATININE, CALCIUM in the last 72 hours.  PT/INR:  Lab Results  Component Value Date   INR 1.91* 04/22/2014   INR 1.68* 04/21/2014   INR 1.51* 04/20/2014   ABG:  INR: Will add last result for INR, ABG once components are confirmed Will add last 4 CBG results once components are confirmed  Assessment/Plan:  1. CV - ST in the low 100's this am. On Lopressor 50 bid and Coumadin. INR up from 1.68 to 1.91. Will discharge on Coumadin 5. Echo showed no pericardial effusion and LVEF 35-40%. Will start low dose Lisinopril and as discussed with Dr. Anne FuSkains, change Lopressor to Toprol XL 100 daily. 2.  Pulmonary - On room  air.Encourage incentive spirometer 3. Dishcarge   Jarissa Sheriff MPA-C 04/22/2014,7:43 AM

## 2014-04-22 NOTE — Progress Notes (Signed)
Chest tube sutures removed,1/2 inch steri strips with benzoin applied per order

## 2014-04-24 ENCOUNTER — Ambulatory Visit (INDEPENDENT_AMBULATORY_CARE_PROVIDER_SITE_OTHER): Payer: BLUE CROSS/BLUE SHIELD | Admitting: *Deleted

## 2014-04-24 DIAGNOSIS — Z954 Presence of other heart-valve replacement: Secondary | ICD-10-CM

## 2014-04-24 DIAGNOSIS — Z5181 Encounter for therapeutic drug level monitoring: Secondary | ICD-10-CM | POA: Insufficient documentation

## 2014-04-24 DIAGNOSIS — Z952 Presence of prosthetic heart valve: Secondary | ICD-10-CM

## 2014-04-24 LAB — POCT INR: INR: 2.5

## 2014-05-01 ENCOUNTER — Ambulatory Visit (INDEPENDENT_AMBULATORY_CARE_PROVIDER_SITE_OTHER): Payer: BLUE CROSS/BLUE SHIELD | Admitting: *Deleted

## 2014-05-01 DIAGNOSIS — Z5181 Encounter for therapeutic drug level monitoring: Secondary | ICD-10-CM

## 2014-05-01 DIAGNOSIS — Z954 Presence of other heart-valve replacement: Secondary | ICD-10-CM

## 2014-05-01 DIAGNOSIS — Z952 Presence of prosthetic heart valve: Secondary | ICD-10-CM

## 2014-05-01 LAB — POCT INR: INR: 2.1

## 2014-05-08 ENCOUNTER — Ambulatory Visit (INDEPENDENT_AMBULATORY_CARE_PROVIDER_SITE_OTHER): Payer: BLUE CROSS/BLUE SHIELD | Admitting: *Deleted

## 2014-05-08 ENCOUNTER — Ambulatory Visit (INDEPENDENT_AMBULATORY_CARE_PROVIDER_SITE_OTHER): Payer: BLUE CROSS/BLUE SHIELD | Admitting: Cardiology

## 2014-05-08 ENCOUNTER — Encounter: Payer: Self-pay | Admitting: Cardiology

## 2014-05-08 VITALS — BP 109/71 | HR 90 | Ht 67.0 in | Wt 187.8 lb

## 2014-05-08 DIAGNOSIS — Z954 Presence of other heart-valve replacement: Secondary | ICD-10-CM

## 2014-05-08 DIAGNOSIS — I5022 Chronic systolic (congestive) heart failure: Secondary | ICD-10-CM

## 2014-05-08 DIAGNOSIS — Z952 Presence of prosthetic heart valve: Secondary | ICD-10-CM

## 2014-05-08 DIAGNOSIS — Z5181 Encounter for therapeutic drug level monitoring: Secondary | ICD-10-CM

## 2014-05-08 LAB — POCT INR: INR: 1.7

## 2014-05-08 MED ORDER — METOPROLOL SUCCINATE ER 100 MG PO TB24: ORAL_TABLET | ORAL | Status: DC

## 2014-05-08 NOTE — Progress Notes (Signed)
Clinical Summary [name] is a 54 y.o.male seen today for hospital follow up of the following medical problems.  1. 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 Walking 20 minutes daily. No LE edema. No chest pain - compliant with coumadin, denies any bleeding issues, he is followed in our coumadin clinic   2. Chronic systolic HF - LVEF dropped post-surgery from 50% to 35-40% postoperatively  - cath Jan 2016 prior to surgery with normal coronaries.  - compliant with beta blocker and ACE-I   PMH Aortic aneurysm Severe aortic regurgitation   Allergies  Allergen Reactions  . Latex Rash     Current Outpatient Prescriptions  Medication Sig Dispense Refill  . hydrocortisone cream 1 % Apply topically 2 (two) times daily. 30 g 0  . lisinopril (PRINIVIL,ZESTRIL) 2.5 MG tablet Take 1 tablet (2.5 mg total) by mouth daily. 30 tablet 1  . metoprolol succinate (TOPROL XL) 100 MG 24 hr tablet Take 1 tablet (100 mg total) by mouth daily. Take with or immediately following a meal. 30 tablet 1  . oxyCODONE (OXY IR/ROXICODONE) 5 MG immediate release tablet Take 1-2 tablets (5-10 mg total) by mouth every 4 (four) hours as needed for severe pain. 30 tablet 0  . warfarin (COUMADIN) 5 MG tablet Take 1 tablet (5 mg total) by mouth daily at 6 PM. Or as directed. 30 tablet 1   No current facility-administered medications for this visit.     Past Surgical History  Procedure Laterality Date  . Left and right heart catheterization with coronary angiogram N/A 04/12/2014    Procedure: LEFT AND RIGHT HEART CATHETERIZATION WITH CORONARY ANGIOGRAM;  Surgeon: Lesleigh NoeHenry W Smith III, MD;  [facility];  Service: Cardiovascular;  Laterality: N/A;  Zachary George. Bentall procedure N/A 04/13/2014    Procedure: BENTALL  PROCEDURE;  Surgeon: Alleen BorneBryan K Bartle, MD;  Location: [facility] OR;  Service: Open Heart Surgery;  Laterality: N/A;  . Tee without cardioversion N/A 04/13/2014    Procedure: TRANSESOPHAGEAL ECHOCARDIOGRAM (TEE);  Surgeon: Alleen BorneBryan K Bartle, MD;  [facility];  Service: Open Heart Surgery;  Laterality: N/A;     Allergies  Allergen Reactions  . Latex Rash      Family History  Problem Relation Age of Onset  . Diabetes Father   . Diabetes Sister      Social History [name] reports that he has quit smoking. His smoking use included Cigarettes. He has a 2.5 pack-year smoking history. He does not have any smokeless tobacco history on file. [name] reports that he does not drink alcohol.   Review of Systems CONSTITUTIONAL: No weight loss, fever, chills, weakness or fatigue.  HEENT: Eyes: No visual loss, blurred vision, double vision or yellow sclerae.No hearing loss, sneezing, congestion, runny nose or sore throat.  SKIN: No rash or itching.  CARDIOVASCULAR: per HPI RESPIRATORY: No shortness of breath, cough or sputum.  GASTROINTESTINAL: No anorexia, nausea, vomiting or diarrhea. No abdominal pain or blood.  GENITOURINARY: No burning on urination, no polyuria NEUROLOGICAL: No headache, dizziness, syncope, paralysis, ataxia, numbness or tingling in the extremities. No change in bowel or bladder control.  MUSCULOSKELETAL: No muscle, back pain, joint pain or stiffness.  LYMPHATICS: No enlarged nodes. No history of splenectomy.  PSYCHIATRIC: No history of  depression or anxiety.  ENDOCRINOLOGIC: No reports of sweating, cold or heat intolerance. No polyuria or polydipsia.  Marland Kitchen   Physical Examination p 90 bp 109/71 Wt 187 lbs BMI 29 Gen: resting comfortably, no acute distress HEENT: no scleral icterus, pupils equal round and reactive, no palptable cervical adenopathy,  CV: RRR, mechanical S2, no m/r/g, no JVD Resp: Clear to auscultation bilaterally GI: abdomen is soft, non-tender,  non-distended, normal bowel sounds, no hepatosplenomegaly MSK: extremities are warm, no edema.  Skin: warm, no rash Neuro:  no focal deficits Psych: appropriate affect   Diagnostic Studies 04/10/14 Echo Study Conclusions  - Left ventricle: The cavity size was normal. Wall thickness was normal. Systolic function was normal. The estimated ejection fraction was in the range of 50% to 55%. The study is not technically sufficient to allow evaluation of LV diastolic function. - Aortic valve: Uncertain number of leaflets, cannot rule out AV bicuspid valve. There was severe regurgitation. The AR vena contracta is 0.8 cm. Valve area (VTI): 3.31 cm^2. Valve area (Vmax): 3.11 cm^2. Valve area (Vmean): 2.83 cm^2. - Aorta: Aortic root dimension: 75 mm (ED). - Aortic root: The aortic root was severely dilated. The dilatation is primarily of the right Sinus of valsalva. The sinotubular junction, proximal ascending aorta, and aortic arch are poorly visualized, cannot evaluate if aneurysmal. - Mitral valve: There was mild regurgitation. - Left atrium: The atrium was severely dilated. - Right ventricle: The cavity size was mildly dilated. - Right atrium: The atrium was moderately dilated. - Atrial septum: No defect or patent foramen ovale was identified. - Pulmonary arteries: Systolic pressure was moderately increased. PA peak pressure: 50 mm Hg (S). - Technically difficult study.  04/21/14 Echo Study Conclusions  - Left ventricle: The cavity size was normal. Wall thickness was normal. Systolic function was moderately reduced. The estimated ejection fraction was in the range of 35% to 40%. Diffuse hypokinesis. - Aortic valve: A mechanical prosthesis was present and functioning normally. Peak velocity (S): 230 cm/s. - Aorta: Aortic root repair intact. - Right ventricle: Systolic function was moderately reduced. - Right atrium: The atrium was mildly dilated. -  Pericardium, extracardiac: There was no pericardial effusion.  Impressions:  - When compared to prior evaluation, EF has decreased.   Jan 2016 Cath  HEMODYNAMICS: Aortic pressure 109/59 mmHg; LV pressure 105/10 mmHg; LVEDP 23 mmHg; RA 8 mmHg; RV 46/11 mmHg; PA 50/23 mmHg; PCWP(mean) 23 mmHg; Cardiac Output 6.24 L/m; AV gradient none  ANGIOGRAPHIC DATA: The left main coronary artery is normal.  The left anterior descending artery is transapical and widely patent. There are mid and distal luminal irregularities and there is some systolic compression in the mid LAD suggesting possible intramyocardial course. 2 large diagonals arise from the proximal to mid LAD.  The left circumflex artery is large and widely patent. The circumflex is dominant..  The right coronary artery is nondominant. No obstruction is noted..  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 There is accompanying 3+ aortic regurgitation. There also appears to be mild enlargement of the ascending aorta.  LEFT VENTRICULOGRAM: Determined by regurgitation from aorta during aortography) in the 20 RAO projection and revealed of normal to mildly dilated LV cavity size with ejection fraction of 45-50%.   IMPRESSIONS: 1. Right coronary sinus of Valsalva aneurysm 2. Significant aortic regurgitation (3+) by aortography 3. Widely patent coronary arteries 4. Low normal to mildly depressed left ventricular systolic function in the 45 to 50% range.  Assessment and Plan  1. Aortic aneurysm with severe AI - s/p Bentall procedure, doing well with no current symptoms - continue coumadin  2. Chronic systolic HF - noted drop in LVEF postoperively, coronaries normal by cath prior to surgery - continue medical therapy, increase Toprol XL to  in AM and  in PM - repeat echo at next visit   F/u 3 months   Antoine Poche, M.D.

## 2014-05-08 NOTE — Patient Instructions (Signed)
Your physician recommends that you schedule a follow-up appointment in: 3 months with Dr. Wyline MoodBranch  Your physician has recommended you make the following change in your medication:   INCREASE TOPROL 100 MG IN THE MORNING AND 50 MG IN THE EVENING   CONTINUE ALL OTHER MEDICATIONS AS DIRECTED   Thank you for choosing [facility]!!

## 2014-05-15 ENCOUNTER — Other Ambulatory Visit: Payer: Self-pay | Admitting: Surgery

## 2014-05-15 DIAGNOSIS — Z952 Presence of prosthetic heart valve: Secondary | ICD-10-CM

## 2014-05-16 ENCOUNTER — Encounter: Payer: Self-pay | Admitting: Surgery

## 2014-05-16 ENCOUNTER — Ambulatory Visit (INDEPENDENT_AMBULATORY_CARE_PROVIDER_SITE_OTHER): Payer: Self-pay | Admitting: Surgery

## 2014-05-16 ENCOUNTER — Ambulatory Visit
Admission: RE | Admit: 2014-05-16 | Discharge: 2014-05-16 | Disposition: A | Discharge status: Home / Self Care | Payer: BLUE CROSS/BLUE SHIELD | Source: Ambulatory Visit | Attending: Surgery | Admitting: Surgery

## 2014-05-16 VITALS — BP 120/78 | HR 76 | Resp 20 | Ht 67.0 in | Wt 185.0 lb

## 2014-05-16 DIAGNOSIS — I719 Aortic aneurysm of unspecified site, without rupture: Secondary | ICD-10-CM

## 2014-05-16 DIAGNOSIS — Z952 Presence of prosthetic heart valve: Secondary | ICD-10-CM

## 2014-05-16 DIAGNOSIS — I7121 Aneurysm of the ascending aorta, without rupture: Secondary | ICD-10-CM

## 2014-05-16 DIAGNOSIS — Z954 Presence of other heart-valve replacement: Secondary | ICD-10-CM

## 2014-05-16 DIAGNOSIS — I351 Nonrheumatic aortic (valve) insufficiency: Secondary | ICD-10-CM

## 2014-05-16 NOTE — Progress Notes (Signed)
      HPI:  Patient returns for routine postoperative follow-up having undergone Bentall procedure using a  27 mm St. Jude mechanical valved graft on 04/13/2014. The patient's early postoperative recovery while in the hospital was notable for an uncomplicated postop course. Since hospital discharge the patient reports that he feels well. He is walking 30 minutes every day and has been increasing his pace. His last INR on 05/08/2014 was 1.7 and his coumadin dose was adjusted by the coumadin clinic in [place]. He is having it checked again tomorrow.   Current Outpatient Prescriptions  Medication Sig Dispense Refill  . hydrocortisone cream 1 % Apply topically 2 (two) times daily. 30 g 0  . lisinopril (PRINIVIL,ZESTRIL) 2.5 MG tablet Take 1 tablet (2.5 mg total) by mouth daily. 30 tablet 1  . metoprolol succinate (TOPROL-XL) 100 MG 24 hr tablet Take 100 mg in morning and 50 mg in evening 90 tablet 3  . oxyCODONE (OXY IR/ROXICODONE) 5 MG immediate release tablet Take 1-2 tablets (5-10 mg total) by mouth every 4 (four) hours as needed for severe pain. 30 tablet 0  . warfarin (COUMADIN) 5 MG tablet Take 1 tablet (5 mg total) by mouth daily at 6 PM. Or as directed. (Patient taking differently: Take 5 mg by mouth daily at 6 PM. Or as directed. LISA MANAGING) 30 tablet 1   No current facility-administered medications for this visit.    Physical Exam: BP 120/78 mmHg  Pulse 76  Resp 20  Ht 5\' 7"  (1.702 m)  Wt 185 lb (83.915 kg)  BMI 28.97 kg/m2  SpO2 98% He looks well Lungs are clear Cardiac exam shows a regular rate and rhythm with normal mechanical heart sounds. There is no murmur The chest incision is healing well and the sternum is stable. There is no significant edema.   Diagnostic Tests:  CLINICAL DATA: Aortic valve replacement.  EXAM: CHEST 2 VIEW  COMPARISON: 04/16/2014. CT 04/11/2014.  FINDINGS: Prior aortic valve replacement. Cardiomegaly with normal  pulmonary vascularity. Mild bibasilar atelectasis. Prominent bilateral nipple shadows. No pleural effusion or pneumothorax.  IMPRESSION: 1. Aortic valve replacement. Stable cardiomegaly. No CHF. 2. Mild bibasilar atelectasis.   Electronically Signed  By: Maisie Fushomas Register  On: 05/16/2014 10:50   Impression:  Overall I think he is doing well. I encouraged him to continue walking. He is not planning to participate in cardiac rehab. He feels that he can do it on his own since he is used to being very active. I told him he could drive his car but should not lift anything heavier than 10 lbs for three months postop. I gave him a note to return to work on 07/13/2014.   Plan:  He will continue to follow up with Dr. Wyline MoodBranch in [place] and will contact me if he has any problems with his incisions. His INR will be followed by the anticoagulation clinic in [place].

## 2014-05-17 ENCOUNTER — Telehealth: Payer: Self-pay | Admitting: Cardiology

## 2014-05-17 ENCOUNTER — Ambulatory Visit (INDEPENDENT_AMBULATORY_CARE_PROVIDER_SITE_OTHER): Payer: BLUE CROSS/BLUE SHIELD | Admitting: *Deleted

## 2014-05-17 DIAGNOSIS — Z5181 Encounter for therapeutic drug level monitoring: Secondary | ICD-10-CM

## 2014-05-17 DIAGNOSIS — Z954 Presence of other heart-valve replacement: Secondary | ICD-10-CM

## 2014-05-17 DIAGNOSIS — Z952 Presence of prosthetic heart valve: Secondary | ICD-10-CM

## 2014-05-17 LAB — POCT INR: INR: 2.8

## 2014-05-17 NOTE — Telephone Encounter (Signed)
Antibiotic for his tooth would have to come from his dentist or primary care. Our only recommendation is that he be on an antibiotic before any dental surgeries to help protect his valve, however we would not prescribe an antibiotic to treat an infection.   Dominga FerryJ Taym Twist MD

## 2014-05-17 NOTE — Telephone Encounter (Signed)
Patient walk in  Wanting antibiotic for his tooth that last a feeling.  York SpanielSaid he was told by Dr Wyline MoodBranch that he would give one too him since his dentist cant see him until couple weeks out

## 2014-05-17 NOTE — Telephone Encounter (Signed)
Will forward to Dr. Wyline MoodBranch, do not see documented that Dr. Wyline MoodBranch would give antibiotic.

## 2014-05-17 NOTE — Telephone Encounter (Signed)
Pt doesn't have pcp and cannot see dentist until next week, pt has antibiotic at home and wants to know if safe to take. Pt will call back with antibiotic name and dose.

## 2014-05-17 NOTE — Telephone Encounter (Signed)
Pt reported as having levofloxacin 500 mg wanting to know if ok to take with heart medications. Explained to pt that Dr. Wyline MoodBranch has left for the day.

## 2014-05-18 NOTE — Telephone Encounter (Signed)
That abx is fine from a heart standpoint. [facility] is [facility].   Dominga FerryJ Ritika Hellickson MD

## 2014-05-18 NOTE — Telephone Encounter (Signed)
Left message to return call & info also left on voice mail.

## 2014-05-18 NOTE — Telephone Encounter (Signed)
Patient notified.  Advised patient to continue his Warfarin exactly the same for now.  Vincent [name] will notify him if he needs INR checked before 05/31/2014.

## 2014-05-21 NOTE — Telephone Encounter (Signed)
Pt started Levaquin 500mg  qd x 10 days on 05/18/14 for tooth infection.  Told pt to decrease coumadin to 1 tablet daily till INR check on 05/24/14.  He verbalized understanding.

## 2014-05-24 ENCOUNTER — Ambulatory Visit (INDEPENDENT_AMBULATORY_CARE_PROVIDER_SITE_OTHER): Payer: BLUE CROSS/BLUE SHIELD | Admitting: *Deleted

## 2014-05-24 DIAGNOSIS — Z5181 Encounter for therapeutic drug level monitoring: Secondary | ICD-10-CM

## 2014-05-24 DIAGNOSIS — Z954 Presence of other heart-valve replacement: Secondary | ICD-10-CM

## 2014-05-24 DIAGNOSIS — Z952 Presence of prosthetic heart valve: Secondary | ICD-10-CM

## 2014-05-24 LAB — POCT INR: INR: 1.7

## 2014-05-31 ENCOUNTER — Ambulatory Visit (INDEPENDENT_AMBULATORY_CARE_PROVIDER_SITE_OTHER): Payer: BLUE CROSS/BLUE SHIELD | Admitting: *Deleted

## 2014-05-31 DIAGNOSIS — Z5181 Encounter for therapeutic drug level monitoring: Secondary | ICD-10-CM

## 2014-05-31 DIAGNOSIS — Z952 Presence of prosthetic heart valve: Secondary | ICD-10-CM

## 2014-05-31 DIAGNOSIS — Z954 Presence of other heart-valve replacement: Secondary | ICD-10-CM

## 2014-05-31 LAB — POCT INR: INR: 2

## 2014-06-09 ENCOUNTER — Other Ambulatory Visit: Payer: Self-pay | Admitting: Physician Assistant

## 2014-06-12 ENCOUNTER — Other Ambulatory Visit: Payer: Self-pay | Admitting: Cardiovascular Disease

## 2014-06-12 MED ORDER — WARFARIN SODIUM 5 MG PO TABS: 5.0000 mg | ORAL_TABLET | Freq: Every day | ORAL | Status: DC

## 2014-06-12 NOTE — Telephone Encounter (Signed)
Out of blood thinner and would like RX called in ASAP to [facility]

## 2014-06-12 NOTE — Telephone Encounter (Signed)
Medication sent to pharmacy  

## 2014-06-18 ENCOUNTER — Other Ambulatory Visit: Payer: Self-pay | Admitting: *Deleted

## 2014-06-18 MED ORDER — LISINOPRIL 2.5 MG PO TABS: 2.5000 mg | ORAL_TABLET | Freq: Every day | ORAL | Status: DC

## 2014-06-18 NOTE — Addendum Note (Signed)
Addended by: Eustace MooreANDERSON, Paylin Hailu M on: 06/18/2014 04:52 PM   Modules accepted: Orders

## 2014-06-19 ENCOUNTER — Ambulatory Visit (INDEPENDENT_AMBULATORY_CARE_PROVIDER_SITE_OTHER): Payer: BLUE CROSS/BLUE SHIELD | Admitting: *Deleted

## 2014-06-19 DIAGNOSIS — Z952 Presence of prosthetic heart valve: Secondary | ICD-10-CM

## 2014-06-19 DIAGNOSIS — Z954 Presence of other heart-valve replacement: Secondary | ICD-10-CM

## 2014-06-19 DIAGNOSIS — Z5181 Encounter for therapeutic drug level monitoring: Secondary | ICD-10-CM

## 2014-06-19 LAB — POCT INR: INR: 1.8

## 2014-06-28 ENCOUNTER — Ambulatory Visit (INDEPENDENT_AMBULATORY_CARE_PROVIDER_SITE_OTHER): Payer: BLUE CROSS/BLUE SHIELD | Admitting: *Deleted

## 2014-06-28 DIAGNOSIS — Z5181 Encounter for therapeutic drug level monitoring: Secondary | ICD-10-CM

## 2014-06-28 DIAGNOSIS — Z954 Presence of other heart-valve replacement: Secondary | ICD-10-CM | POA: Diagnosis not present

## 2014-06-28 DIAGNOSIS — Z952 Presence of prosthetic heart valve: Secondary | ICD-10-CM

## 2014-06-28 LAB — POCT INR: INR: 2.3

## 2014-07-03 ENCOUNTER — Other Ambulatory Visit: Payer: Self-pay | Admitting: *Deleted

## 2014-07-03 MED ORDER — WARFARIN SODIUM 5 MG PO TABS: ORAL_TABLET | ORAL | Status: DC

## 2014-07-17 ENCOUNTER — Ambulatory Visit (INDEPENDENT_AMBULATORY_CARE_PROVIDER_SITE_OTHER): Payer: BLUE CROSS/BLUE SHIELD | Admitting: *Deleted

## 2014-07-17 DIAGNOSIS — Z5181 Encounter for therapeutic drug level monitoring: Secondary | ICD-10-CM | POA: Diagnosis not present

## 2014-07-17 DIAGNOSIS — Z954 Presence of other heart-valve replacement: Secondary | ICD-10-CM | POA: Diagnosis not present

## 2014-07-17 DIAGNOSIS — Z952 Presence of prosthetic heart valve: Secondary | ICD-10-CM

## 2014-07-17 LAB — POCT INR: INR: 2.2

## 2014-08-06 ENCOUNTER — Encounter: Payer: Self-pay | Admitting: *Deleted

## 2014-08-07 ENCOUNTER — Ambulatory Visit (INDEPENDENT_AMBULATORY_CARE_PROVIDER_SITE_OTHER): Payer: BLUE CROSS/BLUE SHIELD | Admitting: Cardiology

## 2014-08-07 ENCOUNTER — Encounter: Payer: Self-pay | Admitting: Cardiology

## 2014-08-07 ENCOUNTER — Ambulatory Visit (INDEPENDENT_AMBULATORY_CARE_PROVIDER_SITE_OTHER): Payer: BLUE CROSS/BLUE SHIELD | Admitting: *Deleted

## 2014-08-07 VITALS — BP 113/68 | HR 64 | Ht 67.0 in | Wt 192.0 lb

## 2014-08-07 DIAGNOSIS — Z954 Presence of other heart-valve replacement: Secondary | ICD-10-CM

## 2014-08-07 DIAGNOSIS — I5022 Chronic systolic (congestive) heart failure: Secondary | ICD-10-CM | POA: Diagnosis not present

## 2014-08-07 DIAGNOSIS — Z5181 Encounter for therapeutic drug level monitoring: Secondary | ICD-10-CM

## 2014-08-07 DIAGNOSIS — Z952 Presence of prosthetic heart valve: Secondary | ICD-10-CM

## 2014-08-07 LAB — POCT INR: INR: 2

## 2014-08-07 NOTE — Progress Notes (Signed)
Clinical Summary Mr. Guevarra is a 54 y.o.male seen today for follow up of the following medical problems.   1. 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  - no chest pain, no SOB, no DOE.  - no bleeding issues on coumadin.   2. Chronic systolic HF - LVEF dropped post-surgery from 50% to 35-40% postoperatively  - cath Jan 2016 prior to surgery with normal coronaries.  - compliant with beta blocker and ACE-I - last visit we increased his Toprol XL to  in AM and  in PM. Tolerating well without side effects.    Past Medical History  Diagnosis Date  . Aortic root aneurysm   . Chronic systolic heart failure      Allergies  Allergen Reactions  . Latex Rash     Current Outpatient Prescriptions  Medication Sig Dispense Refill  . hydrocortisone cream 1 % Apply topically 2 (two) times daily. 30 g 0  . lisinopril (PRINIVIL,ZESTRIL) 2.5 MG tablet Take 1 tablet (2.5 mg total) by mouth daily. 30 tablet 6  . metoprolol succinate (TOPROL-XL) 100 MG 24 hr tablet Take 100 mg in morning and 50 mg in evening 90 tablet 3  . oxyCODONE (OXY IR/ROXICODONE) 5 MG immediate release tablet Take 1-2 tablets (5-10 mg total) by mouth every 4 (four) hours as needed for severe pain. 30 tablet 0  . warfarin (COUMADIN) 5 MG tablet Take coumadin 1 1/2 tablets daily or as directed. 45 tablet 3   No current facility-administered medications for this visit.     Past Surgical History  Procedure Laterality Date  . Left and right heart catheterization with coronary angiogram N/A 04/12/2014    Procedure: LEFT AND RIGHT HEART CATHETERIZATION WITH CORONARY ANGIOGRAM;  Surgeon: Lesleigh Noe, MD;  [facility] C[facility] LAB;  Service: Cardiovascular;  Laterality: N/A;  Zachary George  procedure N/A 04/13/2014    Procedure: BENTALL PROCEDURE;  Surgeon: Alleen Borne, MD;  Location: [facility] OR;  Service: Open Heart Surgery;  Laterality: N/A;  . Tee without cardioversion N/A 04/13/2014    Procedure: TRANSESOPHAGEAL ECHOCARDIOGRAM (TEE);  Surgeon: Alleen Borne, MD;  Location: [facility] OR;  Service: Open Heart Surgery;  Laterality: N/A;     Allergies  Allergen Reactions  . Latex Rash      Family History  Problem Relation Age of Onset  . Diabetes Father   . Diabetes Sister      Social History Mr. Dudding reports that he has quit smoking. His smoking use included Cigarettes. He has a 2.5 pack-year smoking history. He does not have any smokeless tobacco history on file. Mr. Demetro reports that he does not drink alcohol.   Review of Systems CONSTITUTIONAL: No weight loss, fever, chills, weakness or fatigue.  HEENT: Eyes: No visual loss, blurred vision, double vision or yellow sclerae.No hearing loss, sneezing, congestion, runny nose or sore throat.  SKIN: No rash or itching.  CARDIOVASCULAR: per HPI RESPIRATORY: No shortness of breath, cough or sputum.  GASTROINTESTINAL: No anorexia, nausea, vomiting or diarrhea. No abdominal pain or blood.  GENITOURINARY: No burning on urination, no polyuria NEUROLOGICAL: No headache, dizziness, syncope, paralysis, ataxia, numbness or tingling in the extremities. No change in bowel or bladder control.  MUSCULOSKELETAL: No muscle,  back pain, joint pain or stiffness.  LYMPHATICS: No enlarged nodes. No history of splenectomy.  PSYCHIATRIC: No history of depression or anxiety.  ENDOCRINOLOGIC: No reports of sweating, cold or heat intolerance. No polyuria or polydipsia.  Marland Kitchen.   Physical Examination p 64 bp 113/68 Wt 192 lbs BMI 30 Gen: resting comfortably, no acute distress HEENT: no scleral icterus, pupils equal round and reactive, no palptable cervical adenopathy,  CV: RRR, mechanical S2, no m/r/g, no jVD Resp: Clear to auscultation  bilaterally GI: abdomen is soft, non-tender, non-distended, normal bowel sounds, no hepatosplenomegaly MSK: extremities are warm, no edema.  Skin: warm, no rash Neuro:  no focal deficits Psych: appropriate affect   Diagnostic Studies 04/10/14 Echo Study Conclusions  - Left ventricle: The cavity size was normal. Wall thickness was normal. Systolic function was normal. The estimated ejection fraction was in the range of 50% to 55%. The study is not technically sufficient to allow evaluation of LV diastolic function. - Aortic valve: Uncertain number of leaflets, cannot rule out AV bicuspid valve. There was severe regurgitation. The AR vena contracta is 0.8 cm. Valve area (VTI): 3.31 cm^2. Valve area (Vmax): 3.11 cm^2. Valve area (Vmean): 2.83 cm^2. - Aorta: Aortic root dimension: 75 mm (ED). - Aortic root: The aortic root was severely dilated. The dilatation is primarily of the right Sinus of valsalva. The sinotubular junction, proximal ascending aorta, and aortic arch are poorly visualized, cannot evaluate if aneurysmal. - Mitral valve: There was mild regurgitation. - Left atrium: The atrium was severely dilated. - Right ventricle: The cavity size was mildly dilated. - Right atrium: The atrium was moderately dilated. - Atrial septum: No defect or patent foramen ovale was identified. - Pulmonary arteries: Systolic pressure was moderately increased. PA peak pressure: 50 mm Hg (S). - Technically difficult study.  04/21/14 Echo Study Conclusions  - Left ventricle: The cavity size was normal. Wall thickness was normal. Systolic function was moderately reduced. The estimated ejection fraction was in the range of 35% to 40%. Diffuse hypokinesis. - Aortic valve: A mechanical prosthesis was present and functioning normally. Peak velocity (S): 230 cm/s. - Aorta: Aortic root repair intact. - Right ventricle: Systolic function was moderately reduced. - Right  atrium: The atrium was mildly dilated. - Pericardium, extracardiac: There was no pericardial effusion.  Impressions:  - When compared to prior evaluation, EF has decreased.   Jan 2016 Cath  HEMODYNAMICS: Aortic pressure 109/59 mmHg; LV pressure 105/10 mmHg; LVEDP 23 mmHg; RA 8 mmHg; RV 46/11 mmHg; PA 50/23 mmHg; PCWP(mean) 23 mmHg; Cardiac Output 6.24 L/m; AV gradient none  ANGIOGRAPHIC DATA: The left main coronary artery is normal.  The left anterior descending artery is transapical and widely patent. There are mid and distal luminal irregularities and there is some systolic compression in the mid LAD suggesting possible intramyocardial course. 2 large diagonals arise from the proximal to mid LAD.  The left circumflex artery is large and widely patent. The circumflex is dominant..  The right coronary artery is nondominant. No obstruction is noted..  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 There is accompanying 3+ aortic regurgitation. There also appears to be mild enlargement of the ascending aorta.  LEFT VENTRICULOGRAM: Determined by regurgitation from aorta during aortography) in the 20 RAO projection and revealed of normal to mildly dilated LV cavity size with ejection fraction of 45-50%.   IMPRESSIONS: 1. Right coronary sinus of Valsalva aneurysm 2. Significant aortic regurgitation (3+) by aortography 3. Widely  patent coronary arteries 4. Low normal to mildly depressed left ventricular systolic function in the 45 to 50% range.      Assessment and Plan  1. Aortic aneurysm with severe AI - s/p Bentall procedure, doing well with no current symptoms - continue coumadin  2. Chronic systolic HF - repeat echo to see if improved LVEF s/p surgery and on medical therapy - apepars euvolemic, continue current meds  F/u 6 months       Antoine Poche, M.D.

## 2014-08-07 NOTE — Patient Instructions (Signed)
Your physician wants you to follow-up in: 6 months with Dr. Lurena JoinerBranch You will receive a reminder letter in the mail two months in advance. If you don't receive a letter, please call our office to schedule the follow-up appointment.  Your physician recommends that you continue on your current medications as directed. Please refer to the Current Medication list given to you today.  Your physician has requested that you have an echocardiogram. Echocardiography is a painless test that uses sound waves to create images of your heart. It provides your doctor with information about the size and shape of your heart and how well your heart's chambers and valves are working. This procedure takes approximately one hour. There are no restrictions for this procedure.  WE WILL RESCHEDULE YOUR COUMADIN CHECK ON A Thursday SO THAT BOTH CAN  BE DONE SAME DAY  Thank you for choosing [facility]!!

## 2014-08-30 ENCOUNTER — Other Ambulatory Visit: Payer: Self-pay

## 2014-08-30 ENCOUNTER — Telehealth: Payer: Self-pay | Admitting: *Deleted

## 2014-08-30 ENCOUNTER — Ambulatory Visit (INDEPENDENT_AMBULATORY_CARE_PROVIDER_SITE_OTHER): Payer: BLUE CROSS/BLUE SHIELD

## 2014-08-30 ENCOUNTER — Ambulatory Visit (INDEPENDENT_AMBULATORY_CARE_PROVIDER_SITE_OTHER): Payer: BLUE CROSS/BLUE SHIELD | Admitting: *Deleted

## 2014-08-30 DIAGNOSIS — Z952 Presence of prosthetic heart valve: Secondary | ICD-10-CM

## 2014-08-30 DIAGNOSIS — I5022 Chronic systolic (congestive) heart failure: Secondary | ICD-10-CM

## 2014-08-30 DIAGNOSIS — Z5181 Encounter for therapeutic drug level monitoring: Secondary | ICD-10-CM

## 2014-08-30 DIAGNOSIS — Z954 Presence of other heart-valve replacement: Secondary | ICD-10-CM

## 2014-08-30 LAB — POCT INR: INR: 2.5

## 2014-08-30 NOTE — Telephone Encounter (Signed)
Pt made aware, no pcp 

## 2014-08-30 NOTE — Telephone Encounter (Signed)
-----   Message from Antoine PocheJonathan F Branch, MD sent at 08/30/2014 11:45 AM EDT ----- Echo looks good. Heart function is back to normal, his artifical valve is working great without any leaking.  Dina RichJonathan Branch MD

## 2014-10-11 ENCOUNTER — Ambulatory Visit (INDEPENDENT_AMBULATORY_CARE_PROVIDER_SITE_OTHER): Payer: BLUE CROSS/BLUE SHIELD | Admitting: *Deleted

## 2014-10-11 DIAGNOSIS — Z954 Presence of other heart-valve replacement: Secondary | ICD-10-CM | POA: Diagnosis not present

## 2014-10-11 DIAGNOSIS — Z5181 Encounter for therapeutic drug level monitoring: Secondary | ICD-10-CM | POA: Diagnosis not present

## 2014-10-11 DIAGNOSIS — Z952 Presence of prosthetic heart valve: Secondary | ICD-10-CM

## 2014-10-11 LAB — POCT INR: INR: 1.8

## 2014-10-16 ENCOUNTER — Other Ambulatory Visit: Payer: Self-pay | Admitting: *Deleted

## 2014-10-16 MED ORDER — WARFARIN SODIUM 5 MG PO TABS: ORAL_TABLET | ORAL | Status: DC

## 2014-11-01 ENCOUNTER — Ambulatory Visit (INDEPENDENT_AMBULATORY_CARE_PROVIDER_SITE_OTHER): Payer: BLUE CROSS/BLUE SHIELD | Admitting: *Deleted

## 2014-11-01 DIAGNOSIS — Z954 Presence of other heart-valve replacement: Secondary | ICD-10-CM

## 2014-11-01 DIAGNOSIS — Z952 Presence of prosthetic heart valve: Secondary | ICD-10-CM

## 2014-11-01 DIAGNOSIS — Z5181 Encounter for therapeutic drug level monitoring: Secondary | ICD-10-CM

## 2014-11-01 LAB — POCT INR: INR: 3.4

## 2014-11-15 ENCOUNTER — Ambulatory Visit (INDEPENDENT_AMBULATORY_CARE_PROVIDER_SITE_OTHER): Payer: BLUE CROSS/BLUE SHIELD | Admitting: *Deleted

## 2014-11-15 DIAGNOSIS — Z954 Presence of other heart-valve replacement: Secondary | ICD-10-CM | POA: Diagnosis not present

## 2014-11-15 DIAGNOSIS — Z5181 Encounter for therapeutic drug level monitoring: Secondary | ICD-10-CM

## 2014-11-15 DIAGNOSIS — Z952 Presence of prosthetic heart valve: Secondary | ICD-10-CM

## 2014-11-15 LAB — POCT INR: INR: 2.2

## 2014-12-13 ENCOUNTER — Ambulatory Visit (INDEPENDENT_AMBULATORY_CARE_PROVIDER_SITE_OTHER): Payer: BLUE CROSS/BLUE SHIELD | Admitting: *Deleted

## 2014-12-13 DIAGNOSIS — Z5181 Encounter for therapeutic drug level monitoring: Secondary | ICD-10-CM

## 2014-12-13 DIAGNOSIS — Z954 Presence of other heart-valve replacement: Secondary | ICD-10-CM | POA: Diagnosis not present

## 2014-12-13 DIAGNOSIS — Z952 Presence of prosthetic heart valve: Secondary | ICD-10-CM

## 2014-12-13 LAB — POCT INR: INR: 3

## 2015-01-10 ENCOUNTER — Ambulatory Visit (INDEPENDENT_AMBULATORY_CARE_PROVIDER_SITE_OTHER): Payer: BLUE CROSS/BLUE SHIELD | Admitting: Cardiology

## 2015-01-10 ENCOUNTER — Ambulatory Visit (INDEPENDENT_AMBULATORY_CARE_PROVIDER_SITE_OTHER): Payer: BLUE CROSS/BLUE SHIELD | Admitting: *Deleted

## 2015-01-10 ENCOUNTER — Encounter: Payer: Self-pay | Admitting: Cardiology

## 2015-01-10 VITALS — BP 111/67 | HR 54 | Ht 67.0 in | Wt 199.0 lb

## 2015-01-10 DIAGNOSIS — Z5181 Encounter for therapeutic drug level monitoring: Secondary | ICD-10-CM

## 2015-01-10 DIAGNOSIS — Z952 Presence of prosthetic heart valve: Secondary | ICD-10-CM

## 2015-01-10 DIAGNOSIS — Z954 Presence of other heart-valve replacement: Secondary | ICD-10-CM

## 2015-01-10 LAB — POCT INR: INR: 2.8

## 2015-01-10 MED ORDER — WARFARIN SODIUM 5 MG PO TABS: ORAL_TABLET | ORAL | Status: DC

## 2015-01-10 MED ORDER — METOPROLOL SUCCINATE ER 100 MG PO TB24: ORAL_TABLET | ORAL | Status: DC

## 2015-01-10 MED ORDER — LISINOPRIL 2.5 MG PO TABS: 2.5000 mg | ORAL_TABLET | Freq: Every day | ORAL | Status: DC

## 2015-01-10 NOTE — Patient Instructions (Signed)
Your physician wants you to follow-up in: 6 MONTHS WITH DR. BRANCH You will receive a reminder letter in the mail two months in advance. If you don't receive a letter, please call our office to schedule the follow-up appointment.  Your physician recommends that you continue on your current medications as directed. Please refer to the Current Medication list given to you today.  WE HAVE REFILLED YOUR CARDIAC MEDICATIONS   Thank you for choosing [facility]!!

## 2015-01-10 NOTE — Progress Notes (Signed)
Patient ID: Vincent [name], male   DOB: 02-24-1961, 54 y.o.   MRN: [id number]     Clinical Summary Vincent [name] is a 54 y.o.male seen today for follow up of the following medical problems.   1. 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 No LE edema. - no bleeding issues on coumadin.    Past Medical History  Diagnosis Date  . Aortic root aneurysm   . Chronic systolic heart failure      Allergies  Allergen Reactions  . Latex Rash     Current Outpatient Prescriptions  Medication Sig Dispense Refill  . lisinopril (PRINIVIL,ZESTRIL) 2.5 MG tablet Take 1 tablet (2.5 mg total) by mouth daily. 30 tablet 6  . metoprolol succinate (TOPROL-XL) 100 MG 24 hr tablet Take 100 mg in morning and 50 mg in evening 90 tablet 3  . oxyCODONE (OXY IR/ROXICODONE) 5 MG immediate release tablet Take 1-2 tablets (5-10 mg total) by mouth every 4 (four) hours as needed for severe pain. 30 tablet 0  . warfarin (COUMADIN) 5 MG tablet Take coumadin 1 1/2 tablets daily or as directed. 45 tablet 3   No current facility-administered medications for this visit.     Past Surgical History  Procedure Laterality Date  . Left and right heart catheterization with coronary angiogram N/A 04/12/2014    Procedure: LEFT AND RIGHT HEART CATHETERIZATION WITH CORONARY ANGIOGRAM;  Surgeon: Lesleigh Noe, MD;  Location: [facility] CATH LAB;  Service: Cardiovascular;  Laterality: N/A;  Zachary George procedure N/A 04/13/2014    Procedure: BENTALL PROCEDURE;  Surgeon: Alleen Borne, MD;  Location: [facility] OR;  Service: Open Heart Surgery;  Laterality: N/A;  . Tee without cardioversion N/A 04/13/2014    Procedure: TRANSESOPHAGEAL ECHOCARDIOGRAM (TEE);  Surgeon: Alleen Borne, MD;  Location: [facility] OR;  Service: Open Heart Surgery;  Laterality: N/A;      Allergies  Allergen Reactions  . Latex Rash      Family History  Problem Relation Age of Onset  . Diabetes Father   . Diabetes Sister      Social History Mr. Cato reports that he quit smoking about 21 years ago. His smoking use included Cigarettes. He started smoking about 33 years ago. He has a 2.5 pack-year smoking history. He quit smokeless tobacco use about 39 years ago. His smokeless tobacco use included Chew. Mr. Granlund reports that he does not drink alcohol.   Review of Systems CONSTITUTIONAL: No weight loss, fever, chills, weakness or fatigue.  HEENT: Eyes: No visual loss, blurred vision, double vision or yellow sclerae.No hearing loss, sneezing, congestion, runny nose or sore throat.  SKIN: No rash or itching.  CARDIOVASCULAR: per HPI RESPIRATORY: No shortness of breath, cough or sputum.  GASTROINTESTINAL: No anorexia, nausea, vomiting or diarrhea. No abdominal pain or blood.  GENITOURINARY: No burning on urination, no polyuria NEUROLOGICAL: No headache, dizziness, syncope, paralysis, ataxia, numbness or tingling in the extremities. No change in bowel or bladder control.  MUSCULOSKELETAL: No muscle, back pain, joint pain or stiffness.  LYMPHATICS: No enlarged nodes. No history of splenectomy.  PSYCHIATRIC: No history of depression or anxiety.  ENDOCRINOLOGIC: No reports of sweating, cold or heat intolerance. No polyuria or polydipsia.  Marland Kitchen   Physical Examination Filed Vitals:   01/10/15 1137  BP: 111/67  Pulse: 54   Filed Vitals:   01/10/15 1137  Height: 5\' 7"  (1.702 m)  Weight: 199 lb (90.266 kg)    Gen: resting comfortably, no acute distress HEENT: no scleral icterus, pupils equal round and reactive, no palptable cervical adenopathy,  CV: RRR, mechanical S2, no JVD Resp: Clear to auscultation bilaterally GI: abdomen is soft, non-tender, non-distended, normal bowel sounds, no hepatosplenomegaly MSK: extremities are warm, no edema.  Skin: warm, no  rash Neuro:  no focal deficits Psych: appropriate affect   Diagnostic Studies 04/10/14 Echo Study Conclusions  - Left ventricle: The cavity size was normal. Wall thickness was normal. Systolic function was normal. The estimated ejection fraction was in the range of 50% to 55%. The study is not technically sufficient to allow evaluation of LV diastolic function. - Aortic valve: Uncertain number of leaflets, cannot rule out AV bicuspid valve. There was severe regurgitation. The AR vena contracta is 0.8 cm. Valve area (VTI): 3.31 cm^2. Valve area (Vmax): 3.11 cm^2. Valve area (Vmean): 2.83 cm^2. - Aorta: Aortic root dimension: 75 mm (ED). - Aortic root: The aortic root was severely dilated. The dilatation is primarily of the right Sinus of valsalva. The sinotubular junction, proximal ascending aorta, and aortic arch are poorly visualized, cannot evaluate if aneurysmal. - Mitral valve: There was mild regurgitation. - Left atrium: The atrium was severely dilated. - Right ventricle: The cavity size was mildly dilated. - Right atrium: The atrium was moderately dilated. - Atrial septum: No defect or patent foramen ovale was identified. - Pulmonary arteries: Systolic pressure was moderately increased. PA peak pressure: 50 mm Hg (S). - Technically difficult study.  04/21/14 Echo Study Conclusions  - Left ventricle: The cavity size was normal. Wall thickness was normal. Systolic function was moderately reduced. The estimated ejection fraction was in the range of 35% to 40%. Diffuse hypokinesis. - Aortic valve: A mechanical prosthesis was present and functioning normally. Peak velocity (S): 230 cm/s. - Aorta: Aortic root repair intact. - Right ventricle: Systolic function was moderately reduced. - Right atrium: The atrium was mildly dilated. - Pericardium, extracardiac: There was no pericardial effusion.  Impressions:  - When compared to prior  evaluation, EF has decreased.   Jan 2016 Cath  HEMODYNAMICS: Aortic pressure 109/59 mmHg; LV pressure 105/10 mmHg; LVEDP 23 mmHg; RA 8 mmHg; RV 46/11 mmHg; PA 50/23 mmHg; PCWP(mean) 23 mmHg; Cardiac Output 6.24 L/m; AV gradient none  ANGIOGRAPHIC DATA: The left main coronary artery is normal.  The left anterior descending artery is transapical and widely patent. There are mid and distal luminal irregularities and there is some systolic compression in the mid LAD suggesting possible intramyocardial course. 2 large diagonals arise from the proximal to mid LAD.  The left circumflex artery is large and widely patent. The circumflex is dominant..  The right coronary artery is nondominant. No obstruction is noted..  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 There is accompanying 3+ aortic regurgitation. There also appears to be mild enlargement of the ascending aorta.  LEFT VENTRICULOGRAM: Determined by regurgitation from aorta during aortography) in the 20 RAO projection and revealed of normal to mildly dilated LV cavity size with ejection fraction of 45-50%.   IMPRESSIONS: 1. Right coronary sinus of Valsalva aneurysm 2. Significant aortic regurgitation (3+) by aortography 3. Widely patent coronary arteries 4. Low normal to mildly depressed left ventricular systolic function in the 45 to 50% range.   08/2014 Echo Study Conclusions  -  Left ventricle: The cavity size was normal. Wall thickness was normal. Systolic function was normal. The estimated ejection fraction was in the range of 60% to 65%. Wall motion was normal; there were no regional wall motion abnormalities. Left ventricular diastolic function parameters were normal. - Aortic valve: There is a 27 mm ST Jude mechanical valve conduit in the AV/aortic root position. Normal functional mechanical AV. There was no stenosis. There was no significant  regurgitation. Valve area (VTI): 2.03 cm^2. Valve area (Vmax): 2.06 cm^2. Valve area (Vmean): 1.95 cm^2. - Aortic root: The aortic root was normal in size. There is a known aortic conduit at the level of the aortic root. - Left atrium: The atrium was mildly dilated. - Atrial septum: No defect or patent foramen ovale was identified. - Technically adequate study.  Assessment and Plan   1. Aortic aneurysm with severe AI - s/p Bentall procedure, doing well with no current symptoms - continue coumadin    F/u 6 months     Antoine Poche, M.D.

## 2015-02-21 ENCOUNTER — Ambulatory Visit (INDEPENDENT_AMBULATORY_CARE_PROVIDER_SITE_OTHER): Payer: BLUE CROSS/BLUE SHIELD | Admitting: *Deleted

## 2015-02-21 DIAGNOSIS — Z5181 Encounter for therapeutic drug level monitoring: Secondary | ICD-10-CM

## 2015-02-21 DIAGNOSIS — Z952 Presence of prosthetic heart valve: Secondary | ICD-10-CM

## 2015-02-21 DIAGNOSIS — Z954 Presence of other heart-valve replacement: Secondary | ICD-10-CM | POA: Diagnosis not present

## 2015-02-21 LAB — POCT INR: INR: 3

## 2015-04-04 ENCOUNTER — Ambulatory Visit (INDEPENDENT_AMBULATORY_CARE_PROVIDER_SITE_OTHER): Payer: BLUE CROSS/BLUE SHIELD | Admitting: *Deleted

## 2015-04-04 DIAGNOSIS — Z5181 Encounter for therapeutic drug level monitoring: Secondary | ICD-10-CM | POA: Diagnosis not present

## 2015-04-04 DIAGNOSIS — Z954 Presence of other heart-valve replacement: Secondary | ICD-10-CM | POA: Diagnosis not present

## 2015-04-04 DIAGNOSIS — Z952 Presence of prosthetic heart valve: Secondary | ICD-10-CM

## 2015-04-04 LAB — POCT INR: INR: 2.4

## 2015-04-19 ENCOUNTER — Other Ambulatory Visit: Payer: Self-pay | Admitting: *Deleted

## 2015-04-19 MED ORDER — WARFARIN SODIUM 5 MG PO TABS: ORAL_TABLET | ORAL | Status: DC

## 2015-05-16 ENCOUNTER — Ambulatory Visit (INDEPENDENT_AMBULATORY_CARE_PROVIDER_SITE_OTHER): Payer: BLUE CROSS/BLUE SHIELD | Admitting: *Deleted

## 2015-05-16 DIAGNOSIS — Z5181 Encounter for therapeutic drug level monitoring: Secondary | ICD-10-CM

## 2015-05-16 DIAGNOSIS — Z954 Presence of other heart-valve replacement: Secondary | ICD-10-CM | POA: Diagnosis not present

## 2015-05-16 DIAGNOSIS — Z952 Presence of prosthetic heart valve: Secondary | ICD-10-CM

## 2015-05-16 LAB — POCT INR: INR: 3.3

## 2015-05-16 MED ORDER — WARFARIN SODIUM 5 MG PO TABS: ORAL_TABLET | ORAL | Status: DC

## 2015-06-06 ENCOUNTER — Ambulatory Visit (INDEPENDENT_AMBULATORY_CARE_PROVIDER_SITE_OTHER): Payer: BLUE CROSS/BLUE SHIELD | Admitting: *Deleted

## 2015-06-06 DIAGNOSIS — Z954 Presence of other heart-valve replacement: Secondary | ICD-10-CM | POA: Diagnosis not present

## 2015-06-06 DIAGNOSIS — Z5181 Encounter for therapeutic drug level monitoring: Secondary | ICD-10-CM

## 2015-06-06 DIAGNOSIS — Z952 Presence of prosthetic heart valve: Secondary | ICD-10-CM

## 2015-06-06 LAB — POCT INR: INR: 2.6

## 2015-07-09 ENCOUNTER — Ambulatory Visit (INDEPENDENT_AMBULATORY_CARE_PROVIDER_SITE_OTHER): Payer: BLUE CROSS/BLUE SHIELD | Admitting: Cardiology

## 2015-07-09 ENCOUNTER — Ambulatory Visit (INDEPENDENT_AMBULATORY_CARE_PROVIDER_SITE_OTHER): Payer: BLUE CROSS/BLUE SHIELD | Admitting: *Deleted

## 2015-07-09 ENCOUNTER — Encounter: Payer: Self-pay | Admitting: *Deleted

## 2015-07-09 ENCOUNTER — Encounter: Payer: Self-pay | Admitting: Cardiology

## 2015-07-09 VITALS — BP 121/70 | HR 64 | Ht 67.0 in | Wt 208.0 lb

## 2015-07-09 DIAGNOSIS — Z952 Presence of prosthetic heart valve: Secondary | ICD-10-CM

## 2015-07-09 DIAGNOSIS — I519 Heart disease, unspecified: Secondary | ICD-10-CM | POA: Diagnosis not present

## 2015-07-09 DIAGNOSIS — I77810 Thoracic aortic ectasia: Secondary | ICD-10-CM

## 2015-07-09 DIAGNOSIS — I5022 Chronic systolic (congestive) heart failure: Secondary | ICD-10-CM | POA: Diagnosis not present

## 2015-07-09 DIAGNOSIS — Z954 Presence of other heart-valve replacement: Secondary | ICD-10-CM

## 2015-07-09 DIAGNOSIS — Z5181 Encounter for therapeutic drug level monitoring: Secondary | ICD-10-CM | POA: Diagnosis not present

## 2015-07-09 LAB — POCT INR: INR: 2.9

## 2015-07-09 MED ORDER — ASPIRIN EC 81 MG PO TBEC: 81.0000 mg | DELAYED_RELEASE_TABLET | Freq: Every day | ORAL | Status: AC

## 2015-07-09 NOTE — Progress Notes (Signed)
Patient ID: Vincent [name], male   DOB: [birth year], 55 y.o.   MRN: [id number]     Clinical Summary Vincent [name] is a 55 y.o.male  seen today for follow up of the following medical problems.  1. 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 No bleeding troubles on coumadin.   2. LV systolic dysfunction - transient drop in LVEF s/p heart surgery according to echo reports, most recent echo 08/2014 shows normalized function. No current symptoms.   Past Medical History  Diagnosis Date  . Aortic root aneurysm (HCC)   . Chronic systolic heart failure (HCC)      Allergies  Allergen Reactions  . Latex Rash     Current Outpatient Prescriptions  Medication Sig Dispense Refill  . acetaminophen (TYLENOL) 500 MG tablet Take 500 mg by mouth every 6 (six) hours as needed.    Marland Kitchen lisinopril (PRINIVIL,ZESTRIL) 2.5 MG tablet Take 1 tablet (2.5 mg total) by mouth daily. 30 tablet 6  . metoprolol succinate (TOPROL-XL) 100 MG 24 hr tablet Take 100 mg in morning and 50 mg in evening 90 tablet 3  . warfarin (COUMADIN) 5 MG tablet Take 1 1/2 tablets daily except 2 tablets on Tuesdays and Fridays 55 tablet 3   No current facility-administered medications for this visit.     Past Surgical History  Procedure Laterality Date  . Left and right heart catheterization with coronary angiogram N/A 04/12/2014    Procedure: LEFT AND RIGHT HEART CATHETERIZATION WITH CORONARY ANGIOGRAM;  Surgeon: Lesleigh Noe, MD;  Location: [facility] CATH LAB;  Service: Cardiovascular;  Laterality: N/A;  Zachary George procedure N/A 04/13/2014    Procedure: BENTALL PROCEDURE;  Surgeon: Alleen Borne, MD;  Location: [facility] OR;  Service: Open Heart Surgery;  Laterality: N/A;  . Tee without  cardioversion N/A 04/13/2014    Procedure: TRANSESOPHAGEAL ECHOCARDIOGRAM (TEE);  Surgeon: Alleen Borne, MD;  Location: [facility] OR;  Service: Open Heart Surgery;  Laterality: N/A;     Allergies  Allergen Reactions  . Latex Rash      Family History  Problem Relation Age of Onset  . Diabetes Father   . Diabetes Sister      Social History Mr. Brands reports that he quit smoking about 21 years ago. His smoking use included Cigarettes. He started smoking about 33 years ago. He has a 2.5 pack-year smoking history. He quit smokeless tobacco use about 39 years ago. His smokeless tobacco use included Chew. Mr. Leh reports that he does not drink alcohol.   Review of Systems CONSTITUTIONAL: No weight loss, fever, chills, weakness or fatigue.  HEENT: Eyes: No visual loss, blurred vision, double vision or yellow sclerae.No hearing loss, sneezing, congestion, runny nose or sore throat.  SKIN: No rash or itching.  CARDIOVASCULAR: per HPI RESPIRATORY: No shortness of breath, cough or sputum.  GASTROINTESTINAL: No anorexia, nausea, vomiting or diarrhea. No abdominal pain or blood.  GENITOURINARY: No burning on urination, no polyuria NEUROLOGICAL: No headache, dizziness, syncope, paralysis, ataxia, numbness or tingling in the extremities. No change in bowel or bladder control.  MUSCULOSKELETAL: No muscle, back pain, joint pain or stiffness.  LYMPHATICS: No enlarged nodes. No history of splenectomy.  PSYCHIATRIC: No history of depression  or anxiety.  ENDOCRINOLOGIC: No reports of sweating, cold or heat intolerance. No polyuria or polydipsia.  Marland Kitchen.   Physical Examination Filed Vitals:   07/09/15 0803  BP: 121/70  Pulse: 64   Filed Vitals:   07/09/15 0803  Height: 5\' 7"  (1.702 m)  Weight: 208 lb (94.348 kg)    Gen: resting comfortably, no acute distress HEENT: no scleral icterus, pupils equal round and reactive, no palptable cervical adenopathy,  CV: RRR, mechanical S2, no m/rg, no  jvd Resp: Clear to auscultation bilaterally GI: abdomen is soft, non-tender, non-distended, normal bowel sounds, no hepatosplenomegaly MSK: extremities are warm, no edema.  Skin: warm, no rash Neuro:  no focal deficits Psych: appropriate affect   Diagnostic Studies 04/10/14 Echo Study Conclusions  - Left ventricle: The cavity size was normal. Wall thickness was normal. Systolic function was normal. The estimated ejection fraction was in the range of 50% to 55%. The study is not technically sufficient to allow evaluation of LV diastolic function. - Aortic valve: Uncertain number of leaflets, cannot rule out AV bicuspid valve. There was severe regurgitation. The AR vena contracta is 0.8 cm. Valve area (VTI): 3.31 cm^2. Valve area (Vmax): 3.11 cm^2. Valve area (Vmean): 2.83 cm^2. - Aorta: Aortic root dimension: 75 mm (ED). - Aortic root: The aortic root was severely dilated. The dilatation is primarily of the right Sinus of valsalva. The sinotubular junction, proximal ascending aorta, and aortic arch are poorly visualized, cannot evaluate if aneurysmal. - Mitral valve: There was mild regurgitation. - Left atrium: The atrium was severely dilated. - Right ventricle: The cavity size was mildly dilated. - Right atrium: The atrium was moderately dilated. - Atrial septum: No defect or patent foramen ovale was identified. - Pulmonary arteries: Systolic pressure was moderately increased. PA peak pressure: 50 mm Hg (S). - Technically difficult study.  04/21/14 Echo Study Conclusions  - Left ventricle: The cavity size was normal. Wall thickness was normal. Systolic function was moderately reduced. The estimated ejection fraction was in the range of 35% to 40%. Diffuse hypokinesis. - Aortic valve: A mechanical prosthesis was present and functioning normally. Peak velocity (S): 230 cm/s. - Aorta: Aortic root repair intact. - Right ventricle: Systolic function  was moderately reduced. - Right atrium: The atrium was mildly dilated. - Pericardium, extracardiac: There was no pericardial effusion.  Impressions:  - When compared to prior evaluation, EF has decreased.   Jan 2016 Cath  HEMODYNAMICS: Aortic pressure 109/59 mmHg; LV pressure 105/10 mmHg; LVEDP 23 mmHg; RA 8 mmHg; RV 46/11 mmHg; PA 50/23 mmHg; PCWP(mean) 23 mmHg; Cardiac Output 6.24 L/m; AV gradient none  ANGIOGRAPHIC DATA: The left main coronary artery is normal.  The left anterior descending artery is transapical and widely patent. There are mid and distal luminal irregularities and there is some systolic compression in the mid LAD suggesting possible intramyocardial course. 2 large diagonals arise from the proximal to mid LAD.  The left circumflex artery is large and widely patent. The circumflex is dominant..  The right coronary artery is nondominant. No obstruction is noted..  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 There is accompanying 3+ aortic regurgitation. There also appears to be mild enlargement of the ascending aorta.  LEFT VENTRICULOGRAM: Determined by regurgitation from aorta during aortography) in the 20 RAO projection and revealed of normal to mildly dilated LV cavity size with ejection fraction of 45-50%.   IMPRESSIONS: 1. Right coronary sinus of Valsalva aneurysm 2. Significant aortic regurgitation (  3+) by aortography 3. Widely patent coronary arteries 4. Low normal to mildly depressed left ventricular systolic function in the 45 to 50% range.   08/2014 Echo Study Conclusions  - Left ventricle: The cavity size was normal. Wall thickness was normal. Systolic function was normal. The estimated ejection fraction was in the range of 60% to 65%. Wall motion was normal; there were no regional wall motion abnormalities. Left ventricular diastolic function parameters were normal. - Aortic valve:  There is a 27 mm ST Jude mechanical valve conduit in the AV/aortic root position. Normal functional mechanical AV. There was no stenosis. There was no significant regurgitation. Valve area (VTI): 2.03 cm^2. Valve area (Vmax): 2.06 cm^2. Valve area (Vmean): 1.95 cm^2. - Aortic root: The aortic root was normal in size. There is a known aortic conduit at the level of the aortic root. - Left atrium: The atrium was mildly dilated. - Atrial septum: No defect or patent foramen ovale was identified. - Technically adequate study.   07/09/15  Clinic EKG(performed and reviewed in clinic) SR, incomplete RBBB Assessment and Plan  1. Aortic aneurysm with severe AI s/p 27 mm ST Jude mechanical valve conduit (Bentall procedure) Apr 13, 2014 - he is more than 1 year out from surgery, he continues to do well without any signifiacnt symptoms. Echo last year showed normal functioning AVR - we will add ASA  to his regimen in the setting of his mecahnical AV.   F/u 1 year. Request pcp labs     Antoine Poche, M.D.

## 2015-07-09 NOTE — Patient Instructions (Signed)
   Begin Aspirin 81mg daily.   Continue all other medications.   Your physician wants you to follow up in:  1 year.  You will receive a reminder letter in the mail one-two months in advance.  If you don't receive a letter, please call our office to schedule the follow up appointment   

## 2015-07-16 IMAGING — CR DG CHEST 2V
2 series · 2 of 2 positions shown · non-contrast
Comparison: 04/16/2014.  CT 04/11/2014.

CLINICAL DATA: Aortic valve replacement.

EXAM:
CHEST  2 VIEW

[view not recorded (1 of 2)]
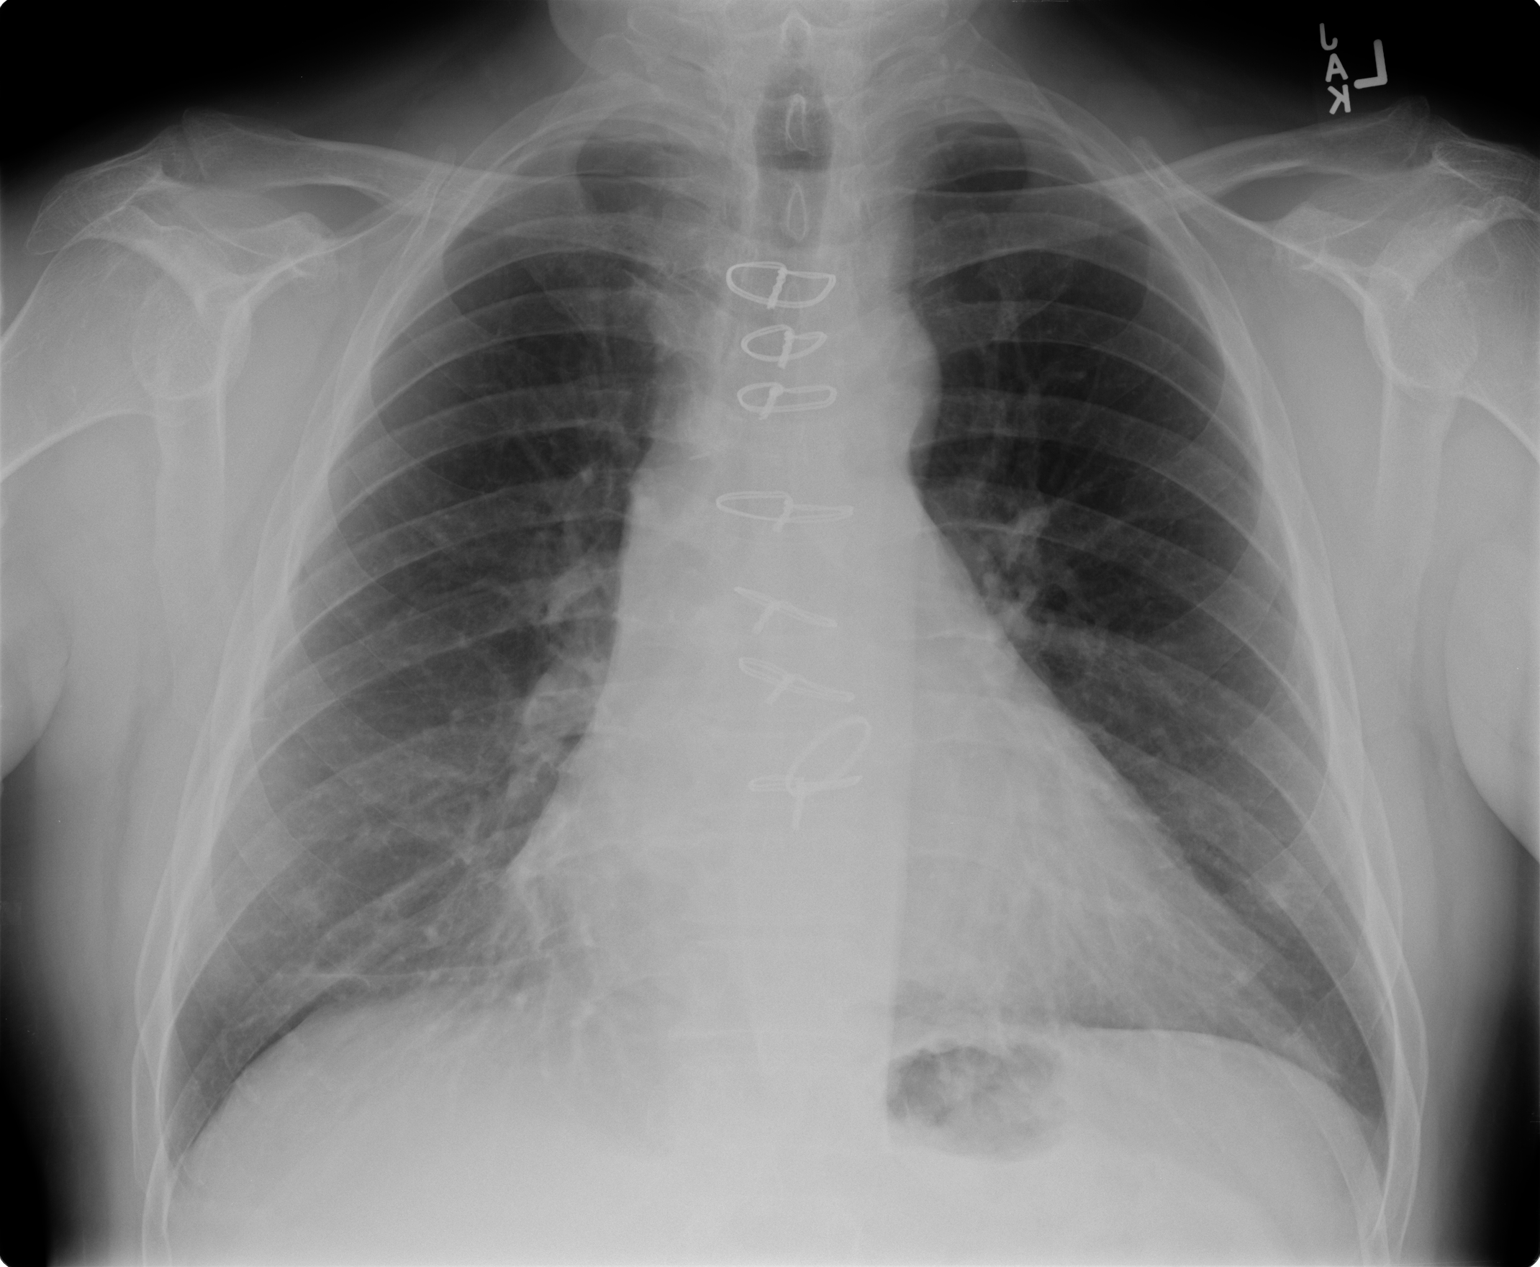

[view not recorded (2 of 2)]
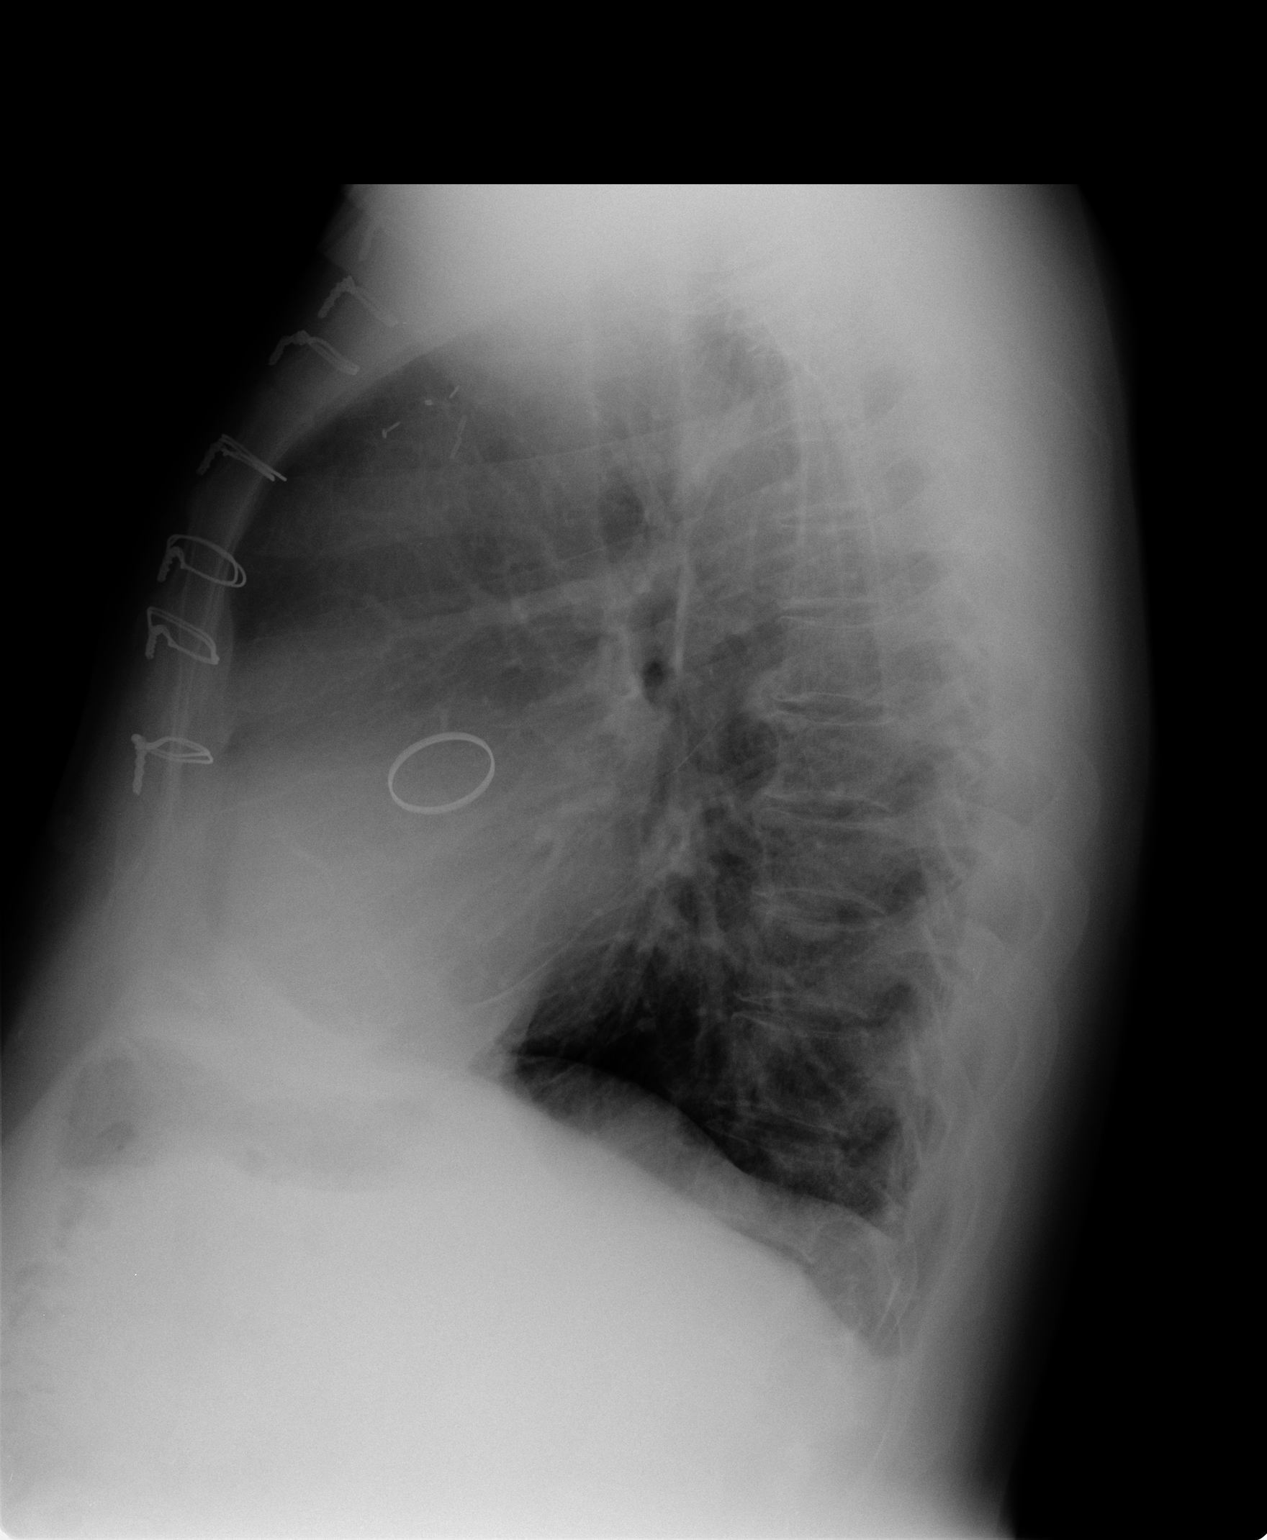

[2 of 2 positions shown; findings below may reference images not displayed]

FINDINGS: Prior aortic valve replacement. Cardiomegaly with normal pulmonary
vascularity. Mild bibasilar atelectasis. Prominent bilateral nipple
shadows. No pleural effusion or pneumothorax.
IMPRESSION: 1. Aortic valve replacement.  Stable cardiomegaly.  No CHF.
2. Mild bibasilar atelectasis.

## 2015-08-06 ENCOUNTER — Ambulatory Visit (INDEPENDENT_AMBULATORY_CARE_PROVIDER_SITE_OTHER): Payer: BLUE CROSS/BLUE SHIELD | Admitting: *Deleted

## 2015-08-06 DIAGNOSIS — Z954 Presence of other heart-valve replacement: Secondary | ICD-10-CM | POA: Diagnosis not present

## 2015-08-06 DIAGNOSIS — Z5181 Encounter for therapeutic drug level monitoring: Secondary | ICD-10-CM

## 2015-08-06 DIAGNOSIS — Z952 Presence of prosthetic heart valve: Secondary | ICD-10-CM

## 2015-08-06 LAB — POCT INR: INR: 3.8

## 2015-08-09 ENCOUNTER — Encounter: Payer: Self-pay | Admitting: *Deleted

## 2015-08-15 ENCOUNTER — Other Ambulatory Visit: Payer: Self-pay | Admitting: *Deleted

## 2015-08-15 MED ORDER — LISINOPRIL 2.5 MG PO TABS: 2.5000 mg | ORAL_TABLET | Freq: Every day | ORAL | Status: DC

## 2015-08-27 ENCOUNTER — Ambulatory Visit (INDEPENDENT_AMBULATORY_CARE_PROVIDER_SITE_OTHER): Payer: BLUE CROSS/BLUE SHIELD | Admitting: *Deleted

## 2015-08-27 DIAGNOSIS — Z5181 Encounter for therapeutic drug level monitoring: Secondary | ICD-10-CM | POA: Diagnosis not present

## 2015-08-27 DIAGNOSIS — Z954 Presence of other heart-valve replacement: Secondary | ICD-10-CM

## 2015-08-27 DIAGNOSIS — Z952 Presence of prosthetic heart valve: Secondary | ICD-10-CM

## 2015-08-27 LAB — POCT INR: INR: 4.1

## 2015-09-10 ENCOUNTER — Ambulatory Visit (INDEPENDENT_AMBULATORY_CARE_PROVIDER_SITE_OTHER): Payer: BLUE CROSS/BLUE SHIELD | Admitting: *Deleted

## 2015-09-10 DIAGNOSIS — Z952 Presence of prosthetic heart valve: Secondary | ICD-10-CM

## 2015-09-10 DIAGNOSIS — Z954 Presence of other heart-valve replacement: Secondary | ICD-10-CM

## 2015-09-10 DIAGNOSIS — Z5181 Encounter for therapeutic drug level monitoring: Secondary | ICD-10-CM | POA: Diagnosis not present

## 2015-09-10 LAB — POCT INR: INR: 2.5

## 2015-09-10 MED ORDER — METOPROLOL SUCCINATE ER 100 MG PO TB24: ORAL_TABLET | ORAL | Status: DC

## 2015-09-10 MED ORDER — WARFARIN SODIUM 5 MG PO TABS: ORAL_TABLET | ORAL | Status: DC

## 2015-10-10 ENCOUNTER — Ambulatory Visit (INDEPENDENT_AMBULATORY_CARE_PROVIDER_SITE_OTHER): Payer: BLUE CROSS/BLUE SHIELD | Admitting: *Deleted

## 2015-10-10 DIAGNOSIS — Z5181 Encounter for therapeutic drug level monitoring: Secondary | ICD-10-CM | POA: Diagnosis not present

## 2015-10-10 DIAGNOSIS — Z954 Presence of other heart-valve replacement: Secondary | ICD-10-CM

## 2015-10-10 DIAGNOSIS — Z952 Presence of prosthetic heart valve: Secondary | ICD-10-CM

## 2015-10-10 LAB — POCT INR: INR: 2.1

## 2015-10-14 ENCOUNTER — Telehealth: Payer: Self-pay | Admitting: Cardiology

## 2015-10-14 MED ORDER — WARFARIN SODIUM 5 MG PO TABS: ORAL_TABLET | ORAL | Status: DC

## 2015-10-14 NOTE — Telephone Encounter (Signed)
Done

## 2015-10-14 NOTE — Telephone Encounter (Signed)
[facility] - [place], [place] - [address]  warfarin (COUMADIN) 5 MG tablet will be out by Wednesday 10/16/15

## 2015-11-07 ENCOUNTER — Ambulatory Visit (INDEPENDENT_AMBULATORY_CARE_PROVIDER_SITE_OTHER): Payer: BLUE CROSS/BLUE SHIELD | Admitting: *Deleted

## 2015-11-07 DIAGNOSIS — Z5181 Encounter for therapeutic drug level monitoring: Secondary | ICD-10-CM | POA: Diagnosis not present

## 2015-11-07 DIAGNOSIS — Z954 Presence of other heart-valve replacement: Secondary | ICD-10-CM | POA: Diagnosis not present

## 2015-11-07 DIAGNOSIS — Z952 Presence of prosthetic heart valve: Secondary | ICD-10-CM

## 2015-11-07 LAB — POCT INR: INR: 1.9

## 2015-12-05 ENCOUNTER — Ambulatory Visit (INDEPENDENT_AMBULATORY_CARE_PROVIDER_SITE_OTHER): Payer: BLUE CROSS/BLUE SHIELD | Admitting: *Deleted

## 2015-12-05 DIAGNOSIS — Z5181 Encounter for therapeutic drug level monitoring: Secondary | ICD-10-CM | POA: Diagnosis not present

## 2015-12-05 DIAGNOSIS — Z952 Presence of prosthetic heart valve: Secondary | ICD-10-CM | POA: Diagnosis not present

## 2015-12-05 DIAGNOSIS — Z954 Presence of other heart-valve replacement: Secondary | ICD-10-CM | POA: Diagnosis not present

## 2015-12-05 LAB — POCT INR: INR: 2.8

## 2016-01-07 ENCOUNTER — Ambulatory Visit (INDEPENDENT_AMBULATORY_CARE_PROVIDER_SITE_OTHER): Payer: BLUE CROSS/BLUE SHIELD | Admitting: *Deleted

## 2016-01-07 DIAGNOSIS — Z952 Presence of prosthetic heart valve: Secondary | ICD-10-CM

## 2016-01-07 DIAGNOSIS — Z5181 Encounter for therapeutic drug level monitoring: Secondary | ICD-10-CM

## 2016-01-07 LAB — POCT INR: INR: 3

## 2016-02-18 ENCOUNTER — Ambulatory Visit (INDEPENDENT_AMBULATORY_CARE_PROVIDER_SITE_OTHER): Payer: BLUE CROSS/BLUE SHIELD | Admitting: *Deleted

## 2016-02-18 DIAGNOSIS — Z952 Presence of prosthetic heart valve: Secondary | ICD-10-CM | POA: Diagnosis not present

## 2016-02-18 DIAGNOSIS — Z5181 Encounter for therapeutic drug level monitoring: Secondary | ICD-10-CM

## 2016-02-18 LAB — POCT INR: INR: 1.8

## 2016-02-18 MED ORDER — WARFARIN SODIUM 5 MG PO TABS: ORAL_TABLET | ORAL | 4 refills | Status: DC

## 2016-03-10 ENCOUNTER — Ambulatory Visit (INDEPENDENT_AMBULATORY_CARE_PROVIDER_SITE_OTHER): Payer: BLUE CROSS/BLUE SHIELD | Admitting: *Deleted

## 2016-03-10 DIAGNOSIS — Z952 Presence of prosthetic heart valve: Secondary | ICD-10-CM | POA: Diagnosis not present

## 2016-03-10 DIAGNOSIS — Z5181 Encounter for therapeutic drug level monitoring: Secondary | ICD-10-CM | POA: Diagnosis not present

## 2016-03-10 LAB — POCT INR: INR: 2.3

## 2016-03-18 ENCOUNTER — Other Ambulatory Visit: Payer: Self-pay | Admitting: *Deleted

## 2016-03-18 MED ORDER — LISINOPRIL 2.5 MG PO TABS: 2.5000 mg | ORAL_TABLET | Freq: Every day | ORAL | 6 refills | Status: DC

## 2016-04-07 ENCOUNTER — Ambulatory Visit (INDEPENDENT_AMBULATORY_CARE_PROVIDER_SITE_OTHER): Payer: BLUE CROSS/BLUE SHIELD | Admitting: *Deleted

## 2016-04-07 DIAGNOSIS — Z952 Presence of prosthetic heart valve: Secondary | ICD-10-CM

## 2016-04-07 DIAGNOSIS — Z5181 Encounter for therapeutic drug level monitoring: Secondary | ICD-10-CM

## 2016-04-07 LAB — POCT INR: INR: 2.6

## 2016-05-12 ENCOUNTER — Ambulatory Visit (INDEPENDENT_AMBULATORY_CARE_PROVIDER_SITE_OTHER): Payer: BLUE CROSS/BLUE SHIELD | Admitting: *Deleted

## 2016-05-12 DIAGNOSIS — Z5181 Encounter for therapeutic drug level monitoring: Secondary | ICD-10-CM | POA: Diagnosis not present

## 2016-05-12 DIAGNOSIS — Z952 Presence of prosthetic heart valve: Secondary | ICD-10-CM | POA: Diagnosis not present

## 2016-05-12 LAB — POCT INR: INR: 2.4

## 2016-05-20 ENCOUNTER — Other Ambulatory Visit: Payer: Self-pay | Admitting: *Deleted

## 2016-05-20 MED ORDER — METOPROLOL SUCCINATE ER 100 MG PO TB24: ORAL_TABLET | ORAL | 3 refills | Status: DC

## 2016-06-23 ENCOUNTER — Ambulatory Visit (INDEPENDENT_AMBULATORY_CARE_PROVIDER_SITE_OTHER): Payer: BLUE CROSS/BLUE SHIELD | Admitting: *Deleted

## 2016-06-23 DIAGNOSIS — Z952 Presence of prosthetic heart valve: Secondary | ICD-10-CM

## 2016-06-23 DIAGNOSIS — Z5181 Encounter for therapeutic drug level monitoring: Secondary | ICD-10-CM

## 2016-06-23 LAB — POCT INR: INR: 3.1

## 2016-07-28 ENCOUNTER — Encounter: Payer: Self-pay | Admitting: Cardiology

## 2016-07-28 ENCOUNTER — Ambulatory Visit (INDEPENDENT_AMBULATORY_CARE_PROVIDER_SITE_OTHER): Payer: BLUE CROSS/BLUE SHIELD | Admitting: Cardiology

## 2016-07-28 ENCOUNTER — Ambulatory Visit (INDEPENDENT_AMBULATORY_CARE_PROVIDER_SITE_OTHER): Payer: BLUE CROSS/BLUE SHIELD | Admitting: *Deleted

## 2016-07-28 ENCOUNTER — Encounter: Payer: Self-pay | Admitting: *Deleted

## 2016-07-28 VITALS — BP 129/78 | HR 55 | Ht 67.0 in | Wt 214.1 lb

## 2016-07-28 DIAGNOSIS — Z5181 Encounter for therapeutic drug level monitoring: Secondary | ICD-10-CM | POA: Diagnosis not present

## 2016-07-28 DIAGNOSIS — I351 Nonrheumatic aortic (valve) insufficiency: Secondary | ICD-10-CM | POA: Diagnosis not present

## 2016-07-28 DIAGNOSIS — I77810 Thoracic aortic ectasia: Secondary | ICD-10-CM

## 2016-07-28 DIAGNOSIS — Z952 Presence of prosthetic heart valve: Secondary | ICD-10-CM | POA: Diagnosis not present

## 2016-07-28 LAB — POCT INR: INR: 2.8

## 2016-07-28 NOTE — Progress Notes (Signed)
Clinical Summary Mr. Vincent Vincent [name] is a 56 y.o.male seen today for follow up of the following medical problems.  1. 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 LV systolic dysfunction - transient drop in LVEF s/p heart surgery according to echo reports, most recent echo 08/2014 shows normalized function.  -he denies any recent DOE, no LE edema      Past Medical History:  Diagnosis Date  . Aortic root aneurysm (HCC)   . Chronic systolic heart failure (HCC)      Allergies  Allergen Reactions  . Latex Rash     Current Outpatient Prescriptions  Medication Sig Dispense Refill  . acetaminophen (TYLENOL) 500 MG tablet Take 500 mg by mouth every 6 (six) hours as needed.    Marland Kitchen aspirin EC 81 MG tablet Take 1 tablet (81 mg total) by mouth daily.    Marland Kitchen lisinopril (PRINIVIL,ZESTRIL) 2.5 MG tablet Take 1 tablet (2.5 mg total) by mouth daily. 30 tablet 6  . methocarbamol (ROBAXIN) 500 MG tablet Take 500 mg by mouth every 6 (six) hours as needed for muscle spasms.    . metoprolol succinate (TOPROL-XL) 100 MG 24 hr tablet Take 100 mg in morning and 50 mg in evening 135 tablet 3  . tamsulosin (FLOMAX) 0.4 MG CAPS capsule Take 0.4 mg by mouth daily.    . traMADol (ULTRAM) 50 MG tablet Take by mouth every 6 (six) hours as needed.    . warfarin (COUMADIN) 5 MG tablet Take 1 1/2 tablets daily 55 tablet 4   No current facility-administered medications for this visit.      Past Surgical History:  Procedure Laterality Date  . BENTALL PROCEDURE N/A 04/13/2014   Procedure: BENTALL PROCEDURE;  Surgeon: Alleen Borne, MD;  Location: [facility] OR;  Service: Open Heart Surgery;  Laterality: N/A;  . LEFT  AND RIGHT HEART CATHETERIZATION WITH CORONARY ANGIOGRAM N/A 04/12/2014   Procedure: LEFT AND RIGHT HEART CATHETERIZATION WITH CORONARY ANGIOGRAM;  Surgeon: Lesleigh Noe, MD;  Location: [facility] CATH LAB;  Service: Cardiovascular;  Laterality: N/A;  . TEE WITHOUT CARDIOVERSION N/A 04/13/2014   Procedure: TRANSESOPHAGEAL ECHOCARDIOGRAM (TEE);  Surgeon: Alleen Borne, MD;  Location: [facility] OR;  Service: Open Heart Surgery;  Laterality: N/A;     Allergies  Allergen Reactions  . Latex Rash      Family History  Problem Relation Age of Onset  . Diabetes Father   . Diabetes Sister      Social History [name] reports that he quit smoking about 23 years ago. His smoking use included Cigarettes. He started smoking about 35 years ago. He has a 2.50 pack-year smoking history. He quit smokeless tobacco use about 41 years ago. His smokeless tobacco use included Chew. Mr. Vincent Vincent [name] reports that he does not drink alcohol.   Review of Systems CONSTITUTIONAL: No weight loss, fever, chills, weakness or fatigue.  HEENT: Eyes: No visual loss, blurred vision, double vision or yellow sclerae.No hearing loss, sneezing, congestion, runny nose or sore throat.  SKIN: No rash or itching.  CARDIOVASCULAR:  RESPIRATORY: No shortness of breath, cough or sputum.  GASTROINTESTINAL: No anorexia, nausea, vomiting or diarrhea.  No abdominal pain or blood.  GENITOURINARY: No burning on urination, no polyuria NEUROLOGICAL: No headache, dizziness, syncope, paralysis, ataxia, numbness or tingling in the extremities. No change in bowel or bladder control.  MUSCULOSKELETAL: No muscle, back pain, joint pain or stiffness.  LYMPHATICS: No enlarged nodes. No history of splenectomy.  PSYCHIATRIC: No history of depression or anxiety.  ENDOCRINOLOGIC: No reports of sweating, cold or heat intolerance. No polyuria or polydipsia.  Marland Kitchen   Physical Examination Vitals:   07/28/16 1513  BP: 129/78  Pulse: (!) 55   Vitals:   07/28/16 1513   Weight: 214 lb 1.6 oz (97.1 kg)  Height:  (1.702 m)    Gen: resting comfortably, no acute distress HEENT: no scleral icterus, pupils equal round and reactive, no palptable cervical adenopathy,  CV: RRR, mechanical S2, no JVD, no murmurs, no jvd Resp: Clear to auscultation bilaterally GI: abdomen is soft, non-tender, non-distended, normal bowel sounds, no hepatosplenomegaly MSK: extremities are warm, no edema.  Skin: warm, no rash Neuro:  no focal deficits Psych: appropriate affect   Diagnostic Studies 04/10/14 Echo Study Conclusions  - Left ventricle: The cavity size was normal. Wall thickness was normal. Systolic function was normal. The estimated ejection fraction was in the range of 50% to 55%. The study is not technically sufficient to allow evaluation of LV diastolic function. - Aortic valve: Uncertain number of leaflets, cannot rule out AV bicuspid valve. There was severe regurgitation. The AR vena contracta is 0.8 cm. Valve area (VTI): 3.31 cm^2. Valve area (Vmax): 3.11 cm^2. Valve area (Vmean): 2.83 cm^2. - Aorta: Aortic root dimension: 75 mm (ED). - Aortic root: The aortic root was severely dilated. The dilatation is primarily of the right Sinus of valsalva. The sinotubular junction, proximal ascending aorta, and aortic arch are poorly visualized, cannot evaluate if aneurysmal. - Mitral valve: There was mild regurgitation. - Left atrium: The atrium was severely dilated. - Right ventricle: The cavity size was mildly dilated. - Right atrium: The atrium was moderately dilated. - Atrial septum: No defect or patent foramen ovale was identified. - Pulmonary arteries: Systolic pressure was moderately increased. PA peak pressure: 50 mm Hg (S). - Technically difficult study.  04/21/14 Echo Study Conclusions  - Left ventricle: The cavity size was normal. Wall thickness was normal. Systolic function was moderately reduced. The  estimated ejection fraction was in the range of 35% to 40%. Diffuse hypokinesis. - Aortic valve: A mechanical prosthesis was present and functioning normally. Peak velocity (S): 230 cm/s. - Aorta: Aortic root repair intact. - Right ventricle: Systolic function was moderately reduced. - Right atrium: The atrium was mildly dilated. - Pericardium, extracardiac: There was no pericardial effusion.  Impressions:  - When compared to prior evaluation, EF has decreased.   Jan 2016 Cath  HEMODYNAMICS: Aortic pressure 109/59 mmHg; LV pressure 105/10 mmHg; LVEDP 23 mmHg; RA 8 mmHg; RV 46/11 mmHg; PA 50/23 mmHg; PCWP(mean) 23 mmHg; Cardiac Output 6.24 L/m; AV gradient none  ANGIOGRAPHIC DATA: The left main coronary artery is normal.  The left anterior descending artery is transapical and widely patent. There are mid and distal luminal irregularities and there is some systolic compression in the mid LAD suggesting possible intramyocardial course. 2 large diagonals arise from the proximal to mid LAD.  The left circumflex artery is large and widely patent. The circumflex is dominant..  The right coronary artery is nondominant. No obstruction is noted..  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  There is accompanying 3+ aortic regurgitation. There also appears to be mild enlargement of the ascending aorta.  LEFT VENTRICULOGRAM: Determined by regurgitation from aorta during aortography) in the 20 RAO projection and revealed of normal to mildly dilated LV cavity size with ejection fraction of 45-50%.   IMPRESSIONS: 1. Right coronary sinus of Valsalva aneurysm 2. Significant aortic regurgitation (3+) by aortography 3. Widely patent coronary arteries 4. Low normal to mildly depressed left ventricular systolic function in the 45 to 50% range.   08/2014 Echo Study Conclusions  - Left ventricle: The cavity size was normal.  Wall thickness was normal. Systolic function was normal. The estimated ejection fraction was in the range of 60% to 65%. Wall motion was normal; there were no regional wall motion abnormalities. Left ventricular diastolic function parameters were normal. - Aortic valve: There is a 27 mm ST Jude mechanical valve conduit in the AV/aortic root position. Normal functional mechanical AV. There was no stenosis. There was no significant regurgitation. Valve area (VTI): 2.03 cm^2. Valve area (Vmax): 2.06 cm^2. Valve area (Vmean): 1.95 cm^2. - Aortic root: The aortic root was normal in size. There is a known aortic conduit at the level of the aortic root. - Left atrium: The atrium was mildly dilated. - Atrial septum: No defect or patent foramen ovale was identified. - Technically adequate study.     Assessment and Plan   Assessment and Plan  1. Aortic aneurysm with severe AI  - s/p 27 mm ST Jude mechanical valve conduit (Bentall procedure) Apr 13, 2014 - he continues to do well, no recent symptoms. EKG in clinic shows SR, RBBB - continue current meds    F/u 1 year.     Antoine Poche, M.D.

## 2016-07-28 NOTE — Patient Instructions (Signed)

## 2016-09-08 ENCOUNTER — Ambulatory Visit (INDEPENDENT_AMBULATORY_CARE_PROVIDER_SITE_OTHER): Payer: BLUE CROSS/BLUE SHIELD | Admitting: *Deleted

## 2016-09-08 DIAGNOSIS — Z952 Presence of prosthetic heart valve: Secondary | ICD-10-CM | POA: Diagnosis not present

## 2016-09-08 DIAGNOSIS — Z5181 Encounter for therapeutic drug level monitoring: Secondary | ICD-10-CM | POA: Diagnosis not present

## 2016-09-08 LAB — POCT INR: INR: 2.1

## 2016-09-08 MED ORDER — WARFARIN SODIUM 5 MG PO TABS: ORAL_TABLET | ORAL | 4 refills | Status: DC

## 2016-10-19 ENCOUNTER — Other Ambulatory Visit: Payer: Self-pay | Admitting: *Deleted

## 2016-10-19 MED ORDER — LISINOPRIL 2.5 MG PO TABS: 2.5000 mg | ORAL_TABLET | Freq: Every day | ORAL | 6 refills | Status: DC

## 2016-10-20 ENCOUNTER — Ambulatory Visit (INDEPENDENT_AMBULATORY_CARE_PROVIDER_SITE_OTHER): Payer: BLUE CROSS/BLUE SHIELD | Admitting: *Deleted

## 2016-10-20 DIAGNOSIS — Z952 Presence of prosthetic heart valve: Secondary | ICD-10-CM | POA: Diagnosis not present

## 2016-10-20 DIAGNOSIS — Z5181 Encounter for therapeutic drug level monitoring: Secondary | ICD-10-CM

## 2016-10-20 LAB — POCT INR: INR: 3.7

## 2016-11-10 ENCOUNTER — Ambulatory Visit (INDEPENDENT_AMBULATORY_CARE_PROVIDER_SITE_OTHER): Payer: BLUE CROSS/BLUE SHIELD | Admitting: *Deleted

## 2016-11-10 DIAGNOSIS — Z5181 Encounter for therapeutic drug level monitoring: Secondary | ICD-10-CM | POA: Diagnosis not present

## 2016-11-10 DIAGNOSIS — Z952 Presence of prosthetic heart valve: Secondary | ICD-10-CM | POA: Diagnosis not present

## 2016-11-10 LAB — POCT INR: INR: 2.3

## 2016-12-08 ENCOUNTER — Ambulatory Visit (INDEPENDENT_AMBULATORY_CARE_PROVIDER_SITE_OTHER): Payer: BLUE CROSS/BLUE SHIELD | Admitting: *Deleted

## 2016-12-08 DIAGNOSIS — Z5181 Encounter for therapeutic drug level monitoring: Secondary | ICD-10-CM

## 2016-12-08 DIAGNOSIS — Z952 Presence of prosthetic heart valve: Secondary | ICD-10-CM

## 2016-12-08 LAB — POCT INR: INR: 2.9

## 2017-01-05 ENCOUNTER — Ambulatory Visit (INDEPENDENT_AMBULATORY_CARE_PROVIDER_SITE_OTHER): Payer: BLUE CROSS/BLUE SHIELD | Admitting: *Deleted

## 2017-01-05 DIAGNOSIS — I351 Nonrheumatic aortic (valve) insufficiency: Secondary | ICD-10-CM | POA: Diagnosis not present

## 2017-01-05 DIAGNOSIS — Z5181 Encounter for therapeutic drug level monitoring: Secondary | ICD-10-CM | POA: Diagnosis not present

## 2017-01-05 DIAGNOSIS — Z952 Presence of prosthetic heart valve: Secondary | ICD-10-CM

## 2017-01-05 LAB — POCT INR: INR: 2.9

## 2017-02-04 DEATH — deceased

## 2017-02-08 ENCOUNTER — Other Ambulatory Visit: Payer: Self-pay | Admitting: *Deleted

## 2017-02-09 MED ORDER — WARFARIN SODIUM 5 MG PO TABS: ORAL_TABLET | ORAL | 4 refills | Status: DC

## 2017-02-16 ENCOUNTER — Ambulatory Visit (INDEPENDENT_AMBULATORY_CARE_PROVIDER_SITE_OTHER): Payer: BLUE CROSS/BLUE SHIELD | Admitting: *Deleted

## 2017-02-16 DIAGNOSIS — Z952 Presence of prosthetic heart valve: Secondary | ICD-10-CM | POA: Diagnosis not present

## 2017-02-16 DIAGNOSIS — Z5181 Encounter for therapeutic drug level monitoring: Secondary | ICD-10-CM | POA: Diagnosis not present

## 2017-02-16 LAB — POCT INR: INR: 2.3

## 2017-04-01 ENCOUNTER — Ambulatory Visit (INDEPENDENT_AMBULATORY_CARE_PROVIDER_SITE_OTHER): Payer: BLUE CROSS/BLUE SHIELD | Admitting: *Deleted

## 2017-04-01 DIAGNOSIS — Z952 Presence of prosthetic heart valve: Secondary | ICD-10-CM

## 2017-04-01 DIAGNOSIS — Z5181 Encounter for therapeutic drug level monitoring: Secondary | ICD-10-CM

## 2017-04-01 LAB — POCT INR: INR: 3.6

## 2017-04-29 ENCOUNTER — Ambulatory Visit (INDEPENDENT_AMBULATORY_CARE_PROVIDER_SITE_OTHER): Payer: BLUE CROSS/BLUE SHIELD | Admitting: *Deleted

## 2017-04-29 DIAGNOSIS — Z5181 Encounter for therapeutic drug level monitoring: Secondary | ICD-10-CM

## 2017-04-29 DIAGNOSIS — Z952 Presence of prosthetic heart valve: Secondary | ICD-10-CM | POA: Diagnosis not present

## 2017-04-29 LAB — POCT INR: INR: 3.1

## 2017-04-29 NOTE — Patient Instructions (Signed)
Continue coumadin 1 1/2 tablets daily Pt wants to increase greens Recheck in 4 weeks

## 2017-05-24 ENCOUNTER — Other Ambulatory Visit: Payer: Self-pay | Admitting: Cardiology

## 2017-05-24 MED ORDER — LISINOPRIL 2.5 MG PO TABS: 2.5000 mg | ORAL_TABLET | Freq: Every day | ORAL | 3 refills | Status: DC

## 2017-05-24 MED ORDER — METOPROLOL SUCCINATE ER 100 MG PO TB24: ORAL_TABLET | ORAL | 1 refills | Status: DC

## 2017-05-24 NOTE — Telephone Encounter (Signed)
° ° ° ° °*  STAT* If patient is at the pharmacy, call can be transferred to refill team.   1. Which medications need to be refilled?both cardiac medications.  Stated he asked pharmacy last week and went by there and they told him we had not sent request back  2. Which pharmacy/location (including street and city if local pharmacy) is medication to be sent to? [facility]  3. Do they need a 30 day or 90 day supply?

## 2017-05-24 NOTE — Telephone Encounter (Signed)
[facility]

## 2017-05-27 ENCOUNTER — Ambulatory Visit (INDEPENDENT_AMBULATORY_CARE_PROVIDER_SITE_OTHER): Payer: BLUE CROSS/BLUE SHIELD | Admitting: *Deleted

## 2017-05-27 DIAGNOSIS — Z952 Presence of prosthetic heart valve: Secondary | ICD-10-CM | POA: Diagnosis not present

## 2017-05-27 DIAGNOSIS — Z5181 Encounter for therapeutic drug level monitoring: Secondary | ICD-10-CM | POA: Diagnosis not present

## 2017-05-27 LAB — POCT INR
INR: 3.1
INR: 2.3

## 2017-05-27 NOTE — Patient Instructions (Signed)
INR 2.3 Continue coumadin 1 1/2 tablets daily Continue greens Recheck in 4 weeks

## 2017-06-24 ENCOUNTER — Ambulatory Visit (INDEPENDENT_AMBULATORY_CARE_PROVIDER_SITE_OTHER): Payer: BLUE CROSS/BLUE SHIELD | Admitting: *Deleted

## 2017-06-24 DIAGNOSIS — Z5181 Encounter for therapeutic drug level monitoring: Secondary | ICD-10-CM

## 2017-06-24 DIAGNOSIS — Z952 Presence of prosthetic heart valve: Secondary | ICD-10-CM | POA: Diagnosis not present

## 2017-06-24 LAB — POCT INR: INR: 1.8

## 2017-06-24 NOTE — Patient Instructions (Signed)
Take 2 tablets tonight and tomorrow night then resume 1 1/2 tablets daily Continue greens Recheck in 3 weeks

## 2017-07-20 ENCOUNTER — Ambulatory Visit (INDEPENDENT_AMBULATORY_CARE_PROVIDER_SITE_OTHER): Payer: BLUE CROSS/BLUE SHIELD | Admitting: *Deleted

## 2017-07-20 DIAGNOSIS — Z952 Presence of prosthetic heart valve: Secondary | ICD-10-CM

## 2017-07-20 DIAGNOSIS — Z5181 Encounter for therapeutic drug level monitoring: Secondary | ICD-10-CM

## 2017-07-20 LAB — POCT INR: INR: 2.3

## 2017-07-20 NOTE — Patient Instructions (Signed)
Continue coumadin 1 1/2 tablets daily  Continue greens.   Recheck in 4 weeks.  

## 2017-08-02 ENCOUNTER — Encounter: Payer: Self-pay | Admitting: Cardiology

## 2017-08-02 ENCOUNTER — Ambulatory Visit: Payer: BLUE CROSS/BLUE SHIELD | Admitting: Cardiology

## 2017-08-02 VITALS — BP 124/70 | HR 64 | Ht 67.0 in | Wt 216.4 lb

## 2017-08-02 DIAGNOSIS — Z952 Presence of prosthetic heart valve: Secondary | ICD-10-CM | POA: Diagnosis not present

## 2017-08-02 DIAGNOSIS — Z8679 Personal history of other diseases of the circulatory system: Secondary | ICD-10-CM | POA: Diagnosis not present

## 2017-08-02 NOTE — Progress Notes (Signed)
Clinical Summary Vincent [name] is a 57 y.o.male seen today for follow up of the following medical problems.    1. 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 No edema. No chest pain.  - no bleeding on coumadin.           Past Medical History:  Diagnosis Date  . Aortic root aneurysm (HCC)   . Chronic systolic heart failure (HCC)      Allergies  Allergen Reactions  . Latex Rash     Current Outpatient Medications  Medication Sig Dispense Refill  . acetaminophen (TYLENOL) 500 MG tablet Take 500 mg by mouth every 6 (six) hours as needed.    Marland Kitchen aspirin EC 81 MG tablet Take 1 tablet (81 mg total) by mouth daily.    Marland Kitchen lisinopril (PRINIVIL,ZESTRIL) 2.5 MG tablet Take 1 tablet (2.5 mg total) by mouth daily. 30 tablet 3  . metoprolol succinate (TOPROL-XL) 100 MG 24 hr tablet Take 100 mg in morning and 50 mg in evening 135 tablet 1  . warfarin (COUMADIN) 5 MG tablet Take 1 1/2 tablets daily 55 tablet 4   No current facility-administered medications for this visit.      Past Surgical History:  Procedure Laterality Date  . BENTALL PROCEDURE N/A 04/13/2014   Procedure: BENTALL PROCEDURE;  Surgeon: Alleen Borne, MD;  Location: [facility] OR;  Service: Open Heart Surgery;  Laterality: N/A;  . LEFT AND RIGHT HEART CATHETERIZATION WITH CORONARY ANGIOGRAM N/A 04/12/2014   Procedure: LEFT AND RIGHT HEART CATHETERIZATION WITH CORONARY ANGIOGRAM;  Surgeon: Lesleigh Noe, MD;  Location: [facility] CATH LAB;  Service: Cardiovascular;  Laterality: N/A;  . TEE WITHOUT CARDIOVERSION N/A 04/13/2014   Procedure: TRANSESOPHAGEAL ECHOCARDIOGRAM (TEE);  Surgeon: Alleen Borne, MD;  Location: [facility] OR;  Service: Open Heart Surgery;  Laterality: N/A;      Allergies  Allergen Reactions  . Latex Rash      Family History  Problem Relation Age of Onset  . Diabetes Father   . Diabetes Sister      Social History Mr. Hari reports that he quit smoking about 24 years ago. His smoking use included cigarettes. He started smoking about 36 years ago. He has a 2.50 pack-year smoking history. He quit smokeless tobacco use about 42 years ago. His smokeless tobacco use included chew. Mr. Miyasato reports that he does not drink alcohol.   Review of Systems CONSTITUTIONAL: No weight loss, fever, chills, weakness or fatigue.  HEENT: Eyes: No visual loss, blurred vision, double vision or yellow sclerae.No hearing loss, sneezing, congestion, runny nose or sore throat.  SKIN: No rash or itching.  CARDIOVASCULAR: per hpi RESPIRATORY: No shortness of breath, cough or sputum.  GASTROINTESTINAL: No anorexia, nausea, vomiting or diarrhea. No abdominal pain or blood.  GENITOURINARY: No burning on urination, no polyuria NEUROLOGICAL: No headache, dizziness, syncope, paralysis, ataxia, numbness or tingling in the extremities. No change in bowel or bladder control.  MUSCULOSKELETAL: No muscle, back pain, joint pain or stiffness.  LYMPHATICS: No enlarged nodes. No history of splenectomy.  PSYCHIATRIC: No history of depression or anxiety.  ENDOCRINOLOGIC: No reports of sweating, cold or heat intolerance. No polyuria or polydipsia.  Marland Kitchen   Physical Examination Vitals:  08/02/17 1502  BP: 124/70  Pulse: 64   Vitals:   08/02/17 1502  Weight: 216 lb 6.4 oz (98.2 kg)  Height:  (1.702 m)    Gen: resting comfortably, no acute distress HEENT: no scleral icterus, pupils equal round and reactive, no palptable cervical adenopathy,  CV: RRR, mechanical S2 Resp: Clear to auscultation bilaterally GI: abdomen is soft, non-tender, non-distended, normal bowel sounds, no hepatosplenomegaly MSK: extremities are warm, no edema.  Skin: warm, no rash Neuro:   no focal deficits Psych: appropriate affect   Diagnostic Studies 04/10/14 Echo Study Conclusions  - Left ventricle: The cavity size was normal. Wall thickness was normal. Systolic function was normal. The estimated ejection fraction was in the range of 50% to 55%. The study is not technically sufficient to allow evaluation of LV diastolic function. - Aortic valve: Uncertain number of leaflets, cannot rule out AV bicuspid valve. There was severe regurgitation. The AR vena contracta is 0.8 cm. Valve area (VTI): 3.31 cm^2. Valve area (Vmax): 3.11 cm^2. Valve area (Vmean): 2.83 cm^2. - Aorta: Aortic root dimension: 75 mm (ED). - Aortic root: The aortic root was severely dilated. The dilatation is primarily of the right Sinus of valsalva. The sinotubular junction, proximal ascending aorta, and aortic arch are poorly visualized, cannot evaluate if aneurysmal. - Mitral valve: There was mild regurgitation. - Left atrium: The atrium was severely dilated. - Right ventricle: The cavity size was mildly dilated. - Right atrium: The atrium was moderately dilated. - Atrial septum: No defect or patent foramen ovale was identified. - Pulmonary arteries: Systolic pressure was moderately increased. PA peak pressure: 50 mm Hg (S). - Technically difficult study.  04/21/14 Echo Study Conclusions  - Left ventricle: The cavity size was normal. Wall thickness was normal. Systolic function was moderately reduced. The estimated ejection fraction was in the range of 35% to 40%. Diffuse hypokinesis. - Aortic valve: A mechanical prosthesis was present and functioning normally. Peak velocity (S): 230 cm/s. - Aorta: Aortic root repair intact. - Right ventricle: Systolic function was moderately reduced. - Right atrium: The atrium was mildly dilated. - Pericardium, extracardiac: There was no pericardial effusion.  Impressions:  - When compared to prior evaluation, EF has  decreased.   Jan 2016 Cath  HEMODYNAMICS: Aortic pressure 109/59 mmHg; LV pressure 105/10 mmHg; LVEDP 23 mmHg; RA 8 mmHg; RV 46/11 mmHg; PA 50/23 mmHg; PCWP(mean) 23 mmHg; Cardiac Output 6.24 L/m; AV gradient none  ANGIOGRAPHIC DATA: The left main coronary artery is normal.  The left anterior descending artery is transapical and widely patent. There are mid and distal luminal irregularities and there is some systolic compression in the mid LAD suggesting possible intramyocardial course. 2 large diagonals arise from the proximal to mid LAD.  The left circumflex artery is large and widely patent. The circumflex is dominant..  The right coronary artery is nondominant. No obstruction is noted..  ASCENDING AORTOGRAPHY:Markedly dilated aorta due to right coronary sinus of Valsalva aneurysm. There is accompanying 3+ aortic regurgitation. There also appears to be mild enlargement of the ascending aorta.  LEFT VENTRICULOGRAM:Determined by regurgitation from aorta during aortography) in the 20 RAO projection and revealed of normal to mildly dilated LV cavity size with ejection fraction of 45-50%.   IMPRESSIONS: 1. Right coronary sinus of Valsalva aneurysm 2. Significant aortic regurgitation (3+)by aortography 3. Widely patent coronary arteries 4. Low normal to mildly depressed left ventricular systolic function in the 45 to 50% range.   08/2014 Echo Study Conclusions  -  Left ventricle: The cavity size was normal. Wall thickness was normal. Systolic function was normal. The estimated ejection fraction was in the range of 60% to 65%. Wall motion was normal; there were no regional wall motion abnormalities. Left ventricular diastolic function parameters were normal. - Aortic valve: There is a 27 mm ST Jude mechanical valve conduit in the AV/aortic root position. Normal functional mechanical AV. There was no stenosis. There was no significant  regurgitation. Valve area (VTI): 2.03 cm^2. Valve area (Vmax): 2.06 cm^2. Valve area (Vmean): 1.95 cm^2. - Aortic root: The aortic root was normal in size. There is a known aortic conduit at the level of the aortic root. - Left atrium: The atrium was mildly dilated. - Atrial septum: No defect or patent foramen ovale was identified. - Technically adequate study.         Assessment and Plan  1. Aortic aneurysm with severe AI  - s/p 27 mm ST Jude mechanical valve conduit (Bentall procedure) Apr 13, 2014 - continues to do well without any symptoms to suggest change in valve function - continue to monitor at this time.  - EKG in clinic today shows SR.        Antoine Poche, M.D

## 2017-08-02 NOTE — Patient Instructions (Signed)
Medication Instructions:  Your physician recommends that you continue on your current medications as directed. Please refer to the Current Medication list given to you today.  Labwork: NONE  Testing/Procedures: NONE  Follow-Up: Your physician wants you to follow-up in: 1 YEAR WITH DR. BRANCH. You will receive a reminder letter in the mail two months in advance. If you don't receive a letter, please call our office to schedule the follow-up appointment.  Any Other Special Instructions Will Be Listed Below (If Applicable).  If you need a refill on your cardiac medications before your next appointment, please call your pharmacy. 

## 2017-08-07 ENCOUNTER — Encounter: Payer: Self-pay | Admitting: Cardiology

## 2017-08-17 ENCOUNTER — Ambulatory Visit (INDEPENDENT_AMBULATORY_CARE_PROVIDER_SITE_OTHER): Payer: BLUE CROSS/BLUE SHIELD | Admitting: *Deleted

## 2017-08-17 DIAGNOSIS — Z5181 Encounter for therapeutic drug level monitoring: Secondary | ICD-10-CM | POA: Diagnosis not present

## 2017-08-17 DIAGNOSIS — Z952 Presence of prosthetic heart valve: Secondary | ICD-10-CM

## 2017-08-17 LAB — POCT INR: INR: 2

## 2017-08-17 NOTE — Patient Instructions (Signed)
Take 2 tablets tonight and tomorrow night then resume 1 1/2 tablets daily Continue greens Recheck in 4 weeks

## 2017-09-09 ENCOUNTER — Other Ambulatory Visit: Payer: Self-pay | Admitting: *Deleted

## 2017-09-09 MED ORDER — WARFARIN SODIUM 5 MG PO TABS: ORAL_TABLET | ORAL | 4 refills | Status: DC

## 2017-09-14 ENCOUNTER — Ambulatory Visit (INDEPENDENT_AMBULATORY_CARE_PROVIDER_SITE_OTHER): Payer: BLUE CROSS/BLUE SHIELD | Admitting: *Deleted

## 2017-09-14 DIAGNOSIS — Z5181 Encounter for therapeutic drug level monitoring: Secondary | ICD-10-CM | POA: Diagnosis not present

## 2017-09-14 DIAGNOSIS — Z952 Presence of prosthetic heart valve: Secondary | ICD-10-CM | POA: Diagnosis not present

## 2017-09-14 LAB — POCT INR: INR: 2.3 (ref 2.0–3.0)

## 2017-09-14 NOTE — Patient Instructions (Signed)
Continue coumadin 1 1/2 tablets daily  Continue greens Recheck in 6 weeks  

## 2017-09-21 ENCOUNTER — Other Ambulatory Visit: Payer: Self-pay | Admitting: *Deleted

## 2017-09-21 MED ORDER — LISINOPRIL 2.5 MG PO TABS: 2.5000 mg | ORAL_TABLET | Freq: Every day | ORAL | 6 refills | Status: DC

## 2017-10-26 ENCOUNTER — Ambulatory Visit (INDEPENDENT_AMBULATORY_CARE_PROVIDER_SITE_OTHER): Payer: BLUE CROSS/BLUE SHIELD | Admitting: *Deleted

## 2017-10-26 DIAGNOSIS — Z5181 Encounter for therapeutic drug level monitoring: Secondary | ICD-10-CM

## 2017-10-26 DIAGNOSIS — Z952 Presence of prosthetic heart valve: Secondary | ICD-10-CM

## 2017-10-26 LAB — POCT INR: INR: 2.9 (ref 2.0–3.0)

## 2017-10-26 NOTE — Patient Instructions (Signed)
Continue coumadin 1 1/2 tablets daily  Continue greens Recheck in 6 weeks  

## 2017-11-24 ENCOUNTER — Telehealth: Payer: Self-pay | Admitting: Cardiology

## 2017-11-24 ENCOUNTER — Other Ambulatory Visit: Payer: Self-pay

## 2017-11-24 MED ORDER — METOPROLOL SUCCINATE ER 100 MG PO TB24: ORAL_TABLET | ORAL | 1 refills | Status: DC

## 2017-11-24 NOTE — Telephone Encounter (Signed)
metoprolol succinate (TOPROL-XL) 100 MG 24 hr tablet   [facility]

## 2017-11-24 NOTE — Telephone Encounter (Signed)
rx sent

## 2017-12-09 ENCOUNTER — Ambulatory Visit (INDEPENDENT_AMBULATORY_CARE_PROVIDER_SITE_OTHER): Payer: Self-pay | Admitting: *Deleted

## 2017-12-09 DIAGNOSIS — Z5181 Encounter for therapeutic drug level monitoring: Secondary | ICD-10-CM

## 2017-12-09 DIAGNOSIS — Z952 Presence of prosthetic heart valve: Secondary | ICD-10-CM

## 2017-12-09 LAB — POCT INR: INR: 4 — AB (ref 2.0–3.0)

## 2017-12-09 NOTE — Patient Instructions (Signed)
Hold coumadin tonight then resume 1 1/2 tablets daily  Continue greens.   Recheck in 3 weeks. 

## 2017-12-30 ENCOUNTER — Ambulatory Visit (INDEPENDENT_AMBULATORY_CARE_PROVIDER_SITE_OTHER): Payer: Self-pay | Admitting: *Deleted

## 2017-12-30 DIAGNOSIS — Z5181 Encounter for therapeutic drug level monitoring: Secondary | ICD-10-CM

## 2017-12-30 DIAGNOSIS — Z952 Presence of prosthetic heart valve: Secondary | ICD-10-CM

## 2017-12-30 LAB — POCT INR: INR: 2.7 (ref 2.0–3.0)

## 2017-12-30 NOTE — Patient Instructions (Signed)
Continue coumadin 1 1/2 tablets daily  Continue greens.   Recheck in 4 weeks.  

## 2018-01-27 ENCOUNTER — Ambulatory Visit (INDEPENDENT_AMBULATORY_CARE_PROVIDER_SITE_OTHER): Payer: BLUE CROSS/BLUE SHIELD | Admitting: *Deleted

## 2018-01-27 DIAGNOSIS — Z5181 Encounter for therapeutic drug level monitoring: Secondary | ICD-10-CM | POA: Diagnosis not present

## 2018-01-27 DIAGNOSIS — Z952 Presence of prosthetic heart valve: Secondary | ICD-10-CM

## 2018-01-27 LAB — POCT INR: INR: 3.9 — AB (ref 2.0–3.0)

## 2018-01-27 NOTE — Patient Instructions (Signed)
Hold coumadin tonight then resume 1 1/2 tablets daily  Continue greens.   Recheck in 3 weeks. 

## 2018-02-17 ENCOUNTER — Ambulatory Visit: Payer: BLUE CROSS/BLUE SHIELD | Admitting: *Deleted

## 2018-02-17 DIAGNOSIS — Z952 Presence of prosthetic heart valve: Secondary | ICD-10-CM

## 2018-02-17 DIAGNOSIS — Z5181 Encounter for therapeutic drug level monitoring: Secondary | ICD-10-CM

## 2018-02-17 LAB — POCT INR: INR: 3.8 — AB (ref 2.0–3.0)

## 2018-02-17 NOTE — Patient Instructions (Signed)
Hold coumadin tonight then decrease dose to 1 1/2 tablets daily except 1 tablet on Sundays and Wednesdays Continue greens Recheck in 3 weeks

## 2018-03-11 ENCOUNTER — Ambulatory Visit: Payer: BLUE CROSS/BLUE SHIELD | Admitting: *Deleted

## 2018-03-11 DIAGNOSIS — Z952 Presence of prosthetic heart valve: Secondary | ICD-10-CM

## 2018-03-11 DIAGNOSIS — Z5181 Encounter for therapeutic drug level monitoring: Secondary | ICD-10-CM

## 2018-03-11 LAB — POCT INR: INR: 1.8 — AB (ref 2.0–3.0)

## 2018-03-11 NOTE — Patient Instructions (Signed)
Take coumadin 2 tablets tonight then increase dose to 1 1/2 tablets daily except 1 tablet on Wednesdays Continue greens Recheck in 3 weeks

## 2018-03-18 ENCOUNTER — Other Ambulatory Visit: Payer: Self-pay | Admitting: *Deleted

## 2018-03-18 MED ORDER — WARFARIN SODIUM 5 MG PO TABS: ORAL_TABLET | ORAL | 2 refills | Status: DC

## 2018-04-07 ENCOUNTER — Ambulatory Visit: Payer: BLUE CROSS/BLUE SHIELD | Admitting: Pharmacist

## 2018-04-07 DIAGNOSIS — Z5181 Encounter for therapeutic drug level monitoring: Secondary | ICD-10-CM

## 2018-04-07 DIAGNOSIS — Z952 Presence of prosthetic heart valve: Secondary | ICD-10-CM

## 2018-04-07 LAB — POCT INR: INR: 2.8 (ref 2.0–3.0)

## 2018-04-07 NOTE — Patient Instructions (Signed)
Description   Continue 1 1/2 tablets daily except 1 tablet on Wednesdays Continue greens Recheck in 4 weeks     

## 2018-05-05 ENCOUNTER — Ambulatory Visit: Payer: BLUE CROSS/BLUE SHIELD | Admitting: Pharmacist

## 2018-05-05 DIAGNOSIS — Z952 Presence of prosthetic heart valve: Secondary | ICD-10-CM

## 2018-05-05 DIAGNOSIS — Z5181 Encounter for therapeutic drug level monitoring: Secondary | ICD-10-CM | POA: Diagnosis not present

## 2018-05-05 LAB — POCT INR: INR: 2.4 (ref 2.0–3.0)

## 2018-05-05 NOTE — Patient Instructions (Signed)
Description   Continue 1 1/2 tablets daily except 1 tablet on Wednesdays Continue greens Recheck in 4 weeks

## 2018-05-06 ENCOUNTER — Other Ambulatory Visit: Payer: Self-pay | Admitting: *Deleted

## 2018-05-06 MED ORDER — LISINOPRIL 2.5 MG PO TABS: 2.5000 mg | ORAL_TABLET | Freq: Every day | ORAL | 1 refills | Status: DC

## 2018-05-31 ENCOUNTER — Ambulatory Visit: Payer: BLUE CROSS/BLUE SHIELD | Admitting: *Deleted

## 2018-05-31 DIAGNOSIS — Z952 Presence of prosthetic heart valve: Secondary | ICD-10-CM

## 2018-05-31 DIAGNOSIS — Z5181 Encounter for therapeutic drug level monitoring: Secondary | ICD-10-CM

## 2018-05-31 LAB — POCT INR: INR: 3.1 — AB (ref 2.0–3.0)

## 2018-05-31 MED ORDER — METOPROLOL SUCCINATE ER 100 MG PO TB24: ORAL_TABLET | ORAL | 1 refills | Status: DC

## 2018-05-31 NOTE — Patient Instructions (Signed)
Continue 1 1/2 tablets daily except 1 tablet on Wednesdays Continue greens Recheck in 4 weeks

## 2018-06-27 ENCOUNTER — Telehealth: Payer: Self-pay | Admitting: Pharmacist

## 2018-06-27 NOTE — Telephone Encounter (Signed)
1. Do you currently have a fever? No 2. Have you recently travelled on a cruise, internationally, or to [place], [place], [place], [place], [place], or [place], [place] [employer]) ? [place] 4 weeks ago, no symptoms since 3. Have you been in contact with someone that is currently pending confirmation of Covid19 testing or has been confirmed to have the Covid19 virus? No 4. Are you currently experiencing fatigue or cough? No  Pt. Advised that we are restricting visitors at this time and anyone present in the vehicle should meet the above criteria as well. Advised that visit will be at curbside for finger stick ONLY and will receive call with instructions.

## 2018-06-28 ENCOUNTER — Ambulatory Visit (INDEPENDENT_AMBULATORY_CARE_PROVIDER_SITE_OTHER): Payer: BLUE CROSS/BLUE SHIELD | Admitting: *Deleted

## 2018-06-28 DIAGNOSIS — Z5181 Encounter for therapeutic drug level monitoring: Secondary | ICD-10-CM | POA: Diagnosis not present

## 2018-06-28 DIAGNOSIS — Z952 Presence of prosthetic heart valve: Secondary | ICD-10-CM | POA: Diagnosis not present

## 2018-06-28 LAB — POCT INR: INR: 2.9 (ref 2.0–3.0)

## 2018-06-28 NOTE — Patient Instructions (Signed)
Continue 1 1/2 tablets daily except 1 tablet on Wednesdays Continue greens Recheck in 6 weeks

## 2018-07-04 ENCOUNTER — Other Ambulatory Visit: Payer: Self-pay

## 2018-07-04 ENCOUNTER — Other Ambulatory Visit: Payer: Self-pay | Admitting: *Deleted

## 2018-07-04 MED ORDER — LISINOPRIL 2.5 MG PO TABS: 2.5000 mg | ORAL_TABLET | Freq: Every day | ORAL | 0 refills | Status: DC

## 2018-07-07 ENCOUNTER — Telehealth: Payer: Self-pay | Admitting: *Deleted

## 2018-07-07 MED ORDER — WARFARIN SODIUM 5 MG PO TABS: ORAL_TABLET | ORAL | 3 refills | Status: DC

## 2018-07-07 NOTE — Telephone Encounter (Signed)
Needs refill on Warfarin sent to [facility] / tg

## 2018-08-09 ENCOUNTER — Other Ambulatory Visit: Payer: Self-pay

## 2018-08-09 ENCOUNTER — Ambulatory Visit (INDEPENDENT_AMBULATORY_CARE_PROVIDER_SITE_OTHER): Payer: BLUE CROSS/BLUE SHIELD | Admitting: *Deleted

## 2018-08-09 DIAGNOSIS — Z952 Presence of prosthetic heart valve: Secondary | ICD-10-CM

## 2018-08-09 DIAGNOSIS — Z5181 Encounter for therapeutic drug level monitoring: Secondary | ICD-10-CM | POA: Diagnosis not present

## 2018-08-09 LAB — POCT INR: INR: 3.3 — AB (ref 2.0–3.0)

## 2018-08-09 NOTE — Patient Instructions (Signed)
Take 1/2 tablet tonight then resume 1 1/2 tablets daily except 1 tablet on Wednesdays Continue greens Recheck in 6 weeks

## 2018-09-13 ENCOUNTER — Ambulatory Visit (INDEPENDENT_AMBULATORY_CARE_PROVIDER_SITE_OTHER): Payer: BC Managed Care – PPO | Admitting: *Deleted

## 2018-09-13 ENCOUNTER — Ambulatory Visit: Payer: BLUE CROSS/BLUE SHIELD | Admitting: Cardiology

## 2018-09-13 DIAGNOSIS — Z5181 Encounter for therapeutic drug level monitoring: Secondary | ICD-10-CM

## 2018-09-13 DIAGNOSIS — Z952 Presence of prosthetic heart valve: Secondary | ICD-10-CM

## 2018-09-13 LAB — POCT INR: INR: 2.3 (ref 2.0–3.0)

## 2018-09-13 NOTE — Patient Instructions (Signed)
Continue coumadin 1 1/2 tablets daily except 1 tablet on Wednesdays Continue greens Recheck in 6 weeks

## 2018-09-22 ENCOUNTER — Telehealth: Payer: Self-pay | Admitting: Cardiology

## 2018-09-22 NOTE — Telephone Encounter (Signed)
Virtual Visit Pre-Appointment Phone Call  "(Name), I am calling you today to discuss your upcoming appointment. We are currently trying to limit exposure to the virus that causes COVID-19 by seeing patients at home rather than in the office."  1. "What is the BEST phone number to call the day of the visit?" - include this in appointment notes  2. Do you have or have access to (through a family member/friend) a smartphone with video capability that we can use for your visit?" a. If yes - list this number in appt notes as cell (if different from BEST phone #) and list the appointment type as a VIDEO visit in appointment notes b. If no - list the appointment type as a PHONE visit in appointment notes  3. Confirm consent - "In the setting of the current Covid19 crisis, you are scheduled for a (phone or video) visit with your provider on (date) at (time).  Just as we do with many in-office visits, in order for you to participate in this visit, we must obtain consent.  If you'd like, I can send this to your mychart (if signed up) or email for you to review.  Otherwise, I can obtain your verbal consent now.  All virtual visits are billed to your insurance company just like a normal visit would be.  By agreeing to a virtual visit, we'd like you to understand that the technology does not allow for your provider to perform an examination, and thus may limit your provider's ability to fully assess your condition. If your provider identifies any concerns that need to be evaluated in person, we will make arrangements to do so.  Finally, though the technology is pretty good, we cannot assure that it will always work on either your or our end, and in the setting of a video visit, we may have to convert it to a phone-only visit.  In either situation, we cannot ensure that we have a secure connection.  Are you willing to proceed?" STAFF: Did the patient verbally acknowledge consent to telehealth visit? Document  YES/NO here: yes  4. Advise patient to be prepared - "Two hours prior to your appointment, go ahead and check your blood pressure, pulse, oxygen saturation, and your weight (if you have the equipment to check those) and write them all down. When your visit starts, your provider will ask you for this information. If you have an Apple Watch or Kardia device, please plan to have heart rate information ready on the day of your appointment. Please have a pen and paper handy nearby the day of the visit as well."  5. Give patient instructions for MyChart download to smartphone OR [email or web address] as below if video visit (depending on what platform provider is using)  6. Inform patient they will receive a phone call 15 minutes prior to their appointment time (may be from unknown caller ID) so they should be prepared to answer    TELEPHONE CALL NOTE  Vincent [name] Kail has been deemed a candidate for a follow-up tele-health visit to limit community exposure during the Covid-19 pandemic. I spoke with the patient via phone to ensure availability of phone/video source, confirm preferred email & phone number, and discuss instructions and expectations.  I reminded Vincent [name] Artola to be prepared with any vital sign and/or heart rhythm information that could potentially be obtained via home monitoring, at the time of his visit. I reminded Vincent [name] Rollo to expect a phone call prior to  his visit.  Vincent [name] 09/22/2018 9:14 AM   INSTRUCTIONS FOR DOWNLOADING THE MYCHART APP TO SMARTPHONE  - The patient must first make sure to have activated MyChart and know their login information - If Apple, go to [employer] and type in MyChart in the search bar and download the app. If Android, ask patient to go to [employer] and type in [place] in the search bar and download the app. The app is free but as with any other app downloads, their phone may require them to verify saved payment information or Apple/Android  password.  - The patient will need to then log into the app with their MyChart username and password, and select [facility] as their healthcare provider to link the account. When it is time for your visit, go to the MyChart app, find appointments, and click Begin Video Visit. Be sure to Select Allow for your device to access the Microphone and Camera for your visit. You will then be connected, and your provider will be with you shortly.  **If they have any issues connecting, or need assistance please contact MyChart service desk [phone])**  **If using a computer, in order to ensure the best quality for their visit they will need to use either of the following Internet Browsers: [employer], or Google Chrome**  IF USING DOXIMITY or DOXY.ME - The patient will receive a link just prior to their visit by text.     FULL LENGTH CONSENT FOR TELE-HEALTH VISIT   I hereby voluntarily request, consent and authorize [facility] and its employed or contracted physicians, physician assistants, nurse practitioners or other licensed health care professionals (the Practitioner), to provide me with telemedicine health care services (the Services") as deemed necessary by the treating Practitioner. I acknowledge and consent to receive the Services by the Practitioner via telemedicine. I understand that the telemedicine visit will involve communicating with the Practitioner through live audiovisual communication technology and the disclosure of certain medical information by electronic transmission. I acknowledge that I have been given the opportunity to request an in-person assessment or other available alternative prior to the telemedicine visit and am voluntarily participating in the telemedicine visit.  I understand that I have the right to withhold or withdraw my consent to the use of telemedicine in the course of my care at any time, without affecting my right to future care or treatment,  and that the Practitioner or I may terminate the telemedicine visit at any time. I understand that I have the right to inspect all information obtained and/or recorded in the course of the telemedicine visit and may receive copies of available information for a reasonable fee.  I understand that some of the potential risks of receiving the Services via telemedicine include:   Delay or interruption in medical evaluation due to technological equipment failure or disruption;  Information transmitted may not be sufficient (e.g. poor resolution of images) to allow for appropriate medical decision making by the Practitioner; and/or   In rare instances, security protocols could fail, causing a breach of personal health information.  Furthermore, I acknowledge that it is my responsibility to provide information about my medical history, conditions and care that is complete and accurate to the best of my ability. I acknowledge that Practitioner's advice, recommendations, and/or decision may be based on factors not within their control, such as incomplete or inaccurate data provided by me or distortions of diagnostic images or specimens that may result from electronic transmissions. I  understand that the practice of medicine is not an exact science and that Practitioner makes no warranties or guarantees regarding treatment outcomes. I acknowledge that I will receive a copy of this consent concurrently upon execution via email to the email address I last provided but may also request a printed copy by calling the office of [facility].    I understand that my insurance will be billed for this visit.   I have read or had this consent read to me.  I understand the contents of this consent, which adequately explains the benefits and risks of the Services being provided via telemedicine.   I have been provided ample opportunity to ask questions regarding this consent and the Services and have had my questions  answered to my satisfaction.  I give my informed consent for the services to be provided through the use of telemedicine in my medical care  By participating in this telemedicine visit I agree to the above.

## 2018-09-30 ENCOUNTER — Telehealth (INDEPENDENT_AMBULATORY_CARE_PROVIDER_SITE_OTHER): Payer: BC Managed Care – PPO | Admitting: Cardiology

## 2018-09-30 VITALS — BP 130/70 | HR 65 | Wt 208.0 lb

## 2018-09-30 DIAGNOSIS — Z8679 Personal history of other diseases of the circulatory system: Secondary | ICD-10-CM | POA: Diagnosis not present

## 2018-09-30 DIAGNOSIS — Z952 Presence of prosthetic heart valve: Secondary | ICD-10-CM

## 2018-09-30 NOTE — Progress Notes (Signed)
Virtual Visit via Telephone Note   This visit type was conducted due to national recommendations for restrictions regarding the COVID-19 Pandemic (e.g. social distancing) in an effort to limit this patient's exposure and mitigate transmission in our community.  Due to his co-morbid illnesses, this patient is at least at moderate risk for complications without adequate follow up.  This format is felt to be most appropriate for this patient at this time.  The patient did not have access to video technology/had technical difficulties with video requiring transitioning to audio format only (telephone).  All issues noted in this document were discussed and addressed.  No physical exam could be performed with this format.  Please refer to the patient's chart for his  consent to telehealth for [facility].   Date:  09/30/2018   ID:  Vincent [name], DOB 29-Aug-1960, MRN [id number]  Patient Location: Home Provider Location: Home  PCP:  Truddie CocoMcClure, Karen, FNP  Cardiologist:  Dina RichBranch, Jonathan, MD  Electrophysiologist:  None   Evaluation Performed:  Follow-Up Visit  Chief Complaint:  1 year follow up  History of Present Illness:    Vincent BilisDanny R Huish is a 58 y.o. male seen today for follow up of the following medical problems.    1. 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     The patient does not have symptoms concerning for COVID-19 infection (fever, chills, cough, or new shortness of breath).    Past Medical History:  Diagnosis Date  . Aortic root aneurysm (HCC)   . Chronic systolic heart failure [facility](HCC)    Past Surgical History:  Procedure Laterality Date  . BENTALL PROCEDURE N/A  04/13/2014   Procedure: BENTALL PROCEDURE;  Surgeon: Alleen BorneBryan K Bartle, MD;  [facility];  Service: Open Heart Surgery;  Laterality: N/A;  . LEFT AND RIGHT HEART CATHETERIZATION WITH CORONARY ANGIOGRAM N/A 04/12/2014   Procedure: LEFT AND RIGHT HEART CATHETERIZATION WITH CORONARY ANGIOGRAM;  Surgeon: Lesleigh NoeHenry W Smith III, MD;  [facility];  Service: Cardiovascular;  Laterality: N/A;  . TEE WITHOUT CARDIOVERSION N/A 04/13/2014   Procedure: TRANSESOPHAGEAL ECHOCARDIOGRAM (TEE);  Surgeon: Alleen BorneBryan K Bartle, MD;  [facility];  Service: Open Heart Surgery;  Laterality: N/A;     No outpatient medications have been marked as taking for the 09/30/18 encounter (Appointment) with Antoine PocheBranch, Jonathan F, MD.     Allergies:   Latex   Social History   Tobacco Use  . Smoking status: Former Smoker    Packs/day: 0.25    Years: 10.00    Pack years: 2.50    Types: Cigarettes    Start date: 07/28/1981    Quit date: 07/28/1993    Years since quitting: 25.1  . Smokeless tobacco: Former NeurosurgeonUser    Types: Chew    Quit date: 07/29/1975  Substance Use Topics  . Alcohol use: No    Alcohol/week: 0.0 standard drinks  . Drug use: No     Family Hx: The patient's family history includes Diabetes in his father and sister.  ROS:   Please see the history of present illness.     All other systems reviewed and are negative.   Prior  CV studies:   The following studies were reviewed today:  04/10/14 Echo Study Conclusions  - Left ventricle: The cavity size was normal. Wall thickness was normal. Systolic function was normal. The estimated ejection fraction was in the range of 50% to 55%. The study is not technically sufficient to allow evaluation of LV diastolic function. - Aortic valve: Uncertain number of leaflets, cannot rule out AV bicuspid valve. There was severe regurgitation. The AR vena contracta is 0.8 cm. Valve area (VTI): 3.31 cm^2. Valve area (Vmax): 3.11 cm^2. Valve area (Vmean):  2.83 cm^2. - Aorta: Aortic root dimension: 75 mm (ED). - Aortic root: The aortic root was severely dilated. The dilatation is primarily of the right Sinus of valsalva. The sinotubular junction, proximal ascending aorta, and aortic arch are poorly visualized, cannot evaluate if aneurysmal. - Mitral valve: There was mild regurgitation. - Left atrium: The atrium was severely dilated. - Right ventricle: The cavity size was mildly dilated. - Right atrium: The atrium was moderately dilated. - Atrial septum: No defect or patent foramen ovale was identified. - Pulmonary arteries: Systolic pressure was moderately increased. PA peak pressure: 50 mm Hg (S). - Technically difficult study.  04/21/14 Echo Study Conclusions  - Left ventricle: The cavity size was normal. Wall thickness was normal. Systolic function was moderately reduced. The estimated ejection fraction was in the range of 35% to 40%. Diffuse hypokinesis. - Aortic valve: A mechanical prosthesis was present and functioning normally. Peak velocity (S): 230 cm/s. - Aorta: Aortic root repair intact. - Right ventricle: Systolic function was moderately reduced. - Right atrium: The atrium was mildly dilated. - Pericardium, extracardiac: There was no pericardial effusion.  Impressions:  - When compared to prior evaluation, EF has decreased.   Jan 2016 Cath  HEMODYNAMICS: Aortic pressure 109/59 mmHg; LV pressure 105/10 mmHg; LVEDP 23 mmHg; RA 8 mmHg; RV 46/11 mmHg; PA 50/23 mmHg; PCWP(mean) 23 mmHg; Cardiac Output 6.24 L/m; AV gradient none  ANGIOGRAPHIC DATA: The left main coronary artery is normal.  The left anterior descending artery is transapical and widely patent. There are mid and distal luminal irregularities and there is some systolic compression in the mid LAD suggesting possible intramyocardial course. 2 large diagonals arise from the proximal to mid LAD.  The left circumflex artery is large and  widely patent. The circumflex is dominant..  The right coronary artery is nondominant. No obstruction is noted..  ASCENDING AORTOGRAPHY:Markedly dilated aorta due to right coronary sinus of Valsalva aneurysm. There is accompanying 3+ aortic regurgitation. There also appears to be mild enlargement of the ascending aorta.  LEFT VENTRICULOGRAM:Determined by regurgitation from aorta during aortography) in the 20 RAO projection and revealed of normal to mildly dilated LV cavity size with ejection fraction of 45-50%.   IMPRESSIONS: 1. Right coronary sinus of Valsalva aneurysm 2. Significant aortic regurgitation (3+)by aortography 3. Widely patent coronary arteries 4. Low normal to mildly depressed left ventricular systolic function in the 45 to 50% range.   08/2014 Echo Study Conclusions  - Left ventricle: The cavity size was normal. Wall thickness was normal. Systolic function was normal. The estimated ejection fraction was in the range of 60% to 65%. Wall motion was normal; there were no regional wall motion abnormalities. Left ventricular diastolic function parameters were normal. - Aortic valve: There is a 27 mm ST Jude mechanical valve conduit in the AV/aortic root position. Normal functional mechanical AV. There was no stenosis. There was no significant regurgitation. Valve area (VTI): 2.03 cm^2. Valve area (Vmax):  2.06 cm^2. Valve area (Vmean): 1.95 cm^2. - Aortic root: The aortic root was normal in size. There is a known aortic conduit at the level of the aortic root. - Left atrium: The atrium was mildly dilated. - Atrial septum: No defect or patent foramen ovale was identified. - Technically adequate study.  Labs/Other Tests and Data Reviewed:    EKG:  No ECG reviewed.  Recent Labs: No results found for requested labs within last 8760 hours.   Recent Lipid Panel No results found for: CHOL, TRIG, HDL, CHOLHDL, LDLCALC, LDLDIRECT  Wt Readings  from Last 3 Encounters:  08/02/17 216 lb 6.4 oz (98.2 kg)  07/28/16 214 lb 1.6 oz (97.1 kg)  07/09/15 208 lb (94.3 kg)     Objective:    Vital Signs:   Today's Vitals   09/30/18 1433  BP: 130/70  Pulse: 65  Weight: 208 lb (94.3 kg)   Body mass index is 32.58 kg/m.  Normal affect. Normal speech pattern and tone. Comfortable, no apparent distress. No audible signs of SOB or wheezing.   ASSESSMENT & PLAN:    1. Aortic aneurysm with severe AI -s/p 27 mm ST Jude mechanical valve conduit (Bentall procedure) Apr 13, 2014 - no symptoms - continue coumadin and ASA - continue to monitor  COVID-19 Education: The signs and symptoms of COVID-19 were discussed with the patient and how to seek care for testing (follow up with PCP or arrange E-visit).  The importance of social distancing was discussed today.  Time:   Today, I have spent 10 minutes with the patient with telehealth technology discussing the above problems.     Medication Adjustments/Labs and Tests Ordered: Current medicines are reviewed at length with the patient today.  Concerns regarding medicines are outlined above.   Tests Ordered: No orders of the defined types were placed in this encounter.   Medication Changes: No orders of the defined types were placed in this encounter.   Follow Up:  In Person in 1 year(s)  Signed, Carlyle Dolly, MD  09/30/2018 1:33 PM    [facility]

## 2018-09-30 NOTE — Patient Instructions (Signed)

## 2018-10-07 ENCOUNTER — Other Ambulatory Visit: Payer: Self-pay | Admitting: Cardiology

## 2018-10-07 MED ORDER — LISINOPRIL 2.5 MG PO TABS: 2.5000 mg | ORAL_TABLET | Freq: Every day | ORAL | 1 refills | Status: DC

## 2018-10-25 ENCOUNTER — Ambulatory Visit (INDEPENDENT_AMBULATORY_CARE_PROVIDER_SITE_OTHER): Payer: BC Managed Care – PPO | Admitting: *Deleted

## 2018-10-25 DIAGNOSIS — Z952 Presence of prosthetic heart valve: Secondary | ICD-10-CM

## 2018-10-25 DIAGNOSIS — Z5181 Encounter for therapeutic drug level monitoring: Secondary | ICD-10-CM

## 2018-10-25 LAB — POCT INR: INR: 3.6 — AB (ref 2.0–3.0)

## 2018-10-25 NOTE — Patient Instructions (Signed)
Hold coumadin tonight then resume 1 1/2 tablets daily except 1 tablet on Wednesdays Continue greens Recheck in 6 weeks

## 2018-11-29 ENCOUNTER — Telehealth: Payer: Self-pay | Admitting: Cardiology

## 2018-11-29 ENCOUNTER — Other Ambulatory Visit: Payer: Self-pay | Admitting: Cardiology

## 2018-11-29 NOTE — Telephone Encounter (Signed)
Forward to pharmD for advice.

## 2018-11-29 NOTE — Telephone Encounter (Signed)
This is just fine to take, will not interact with his current meds.

## 2018-11-29 NOTE — Telephone Encounter (Signed)
Patient called asking about starting to take Cephalexin 500mg  2 twice dailey Started it last night. Has sinus infection along with Covid Virus

## 2018-11-29 NOTE — Telephone Encounter (Signed)
Patient notified and verbalized understanding. 

## 2018-12-06 ENCOUNTER — Ambulatory Visit (INDEPENDENT_AMBULATORY_CARE_PROVIDER_SITE_OTHER): Payer: BC Managed Care – PPO | Admitting: *Deleted

## 2018-12-06 ENCOUNTER — Other Ambulatory Visit: Payer: Self-pay

## 2018-12-06 DIAGNOSIS — Z5181 Encounter for therapeutic drug level monitoring: Secondary | ICD-10-CM

## 2018-12-06 DIAGNOSIS — Z952 Presence of prosthetic heart valve: Secondary | ICD-10-CM

## 2018-12-06 LAB — POCT INR: INR: 9.2 — AB (ref 2.0–3.0)

## 2018-12-06 NOTE — Patient Instructions (Signed)
Hold coumadin x 5 days (today thru Saturday)  On Sundays start 1 tablet daily until INR check on 12/14/18 Continue greens Bleeding and fall precautions discussed with pt.  Told pt to go to ED for any adverse side effects as mentioned.  Pt verbalized understanding.

## 2018-12-09 ENCOUNTER — Other Ambulatory Visit: Payer: Self-pay

## 2018-12-09 MED ORDER — METOPROLOL SUCCINATE ER 100 MG PO TB24: ORAL_TABLET | ORAL | 1 refills | Status: DC

## 2018-12-09 NOTE — Telephone Encounter (Signed)
Medication sent to pharmacy  

## 2018-12-09 NOTE — Telephone Encounter (Signed)
° ° ° °  1. Which medications need to be refilled? (please list name of each medication and dose if known) metoprolol succinate (TOPROL-XL) 100    2. Which pharmacy/location (including street and city if local pharmacy) is medication to be sent to    [place], [place]   3. Do they need a 30 day or 90 day supply?

## 2018-12-14 ENCOUNTER — Ambulatory Visit (INDEPENDENT_AMBULATORY_CARE_PROVIDER_SITE_OTHER): Payer: BC Managed Care – PPO | Admitting: *Deleted

## 2018-12-14 ENCOUNTER — Other Ambulatory Visit: Payer: Self-pay

## 2018-12-14 DIAGNOSIS — Z952 Presence of prosthetic heart valve: Secondary | ICD-10-CM

## 2018-12-14 DIAGNOSIS — Z5181 Encounter for therapeutic drug level monitoring: Secondary | ICD-10-CM | POA: Diagnosis not present

## 2018-12-14 LAB — POCT INR: INR: 1.1 — AB (ref 2.0–3.0)

## 2018-12-14 NOTE — Patient Instructions (Signed)
Take coumadin 2 tablets tonight then resume 1 1/2 tablets daily except 1 tablet on Wednesdays Recheck in 10 days

## 2018-12-28 ENCOUNTER — Other Ambulatory Visit: Payer: Self-pay

## 2018-12-28 ENCOUNTER — Ambulatory Visit (INDEPENDENT_AMBULATORY_CARE_PROVIDER_SITE_OTHER): Payer: BC Managed Care – PPO | Admitting: *Deleted

## 2018-12-28 DIAGNOSIS — Z952 Presence of prosthetic heart valve: Secondary | ICD-10-CM | POA: Diagnosis not present

## 2018-12-28 DIAGNOSIS — Z5181 Encounter for therapeutic drug level monitoring: Secondary | ICD-10-CM | POA: Diagnosis not present

## 2018-12-28 LAB — POCT INR: INR: 3.9 — AB (ref 2.0–3.0)

## 2018-12-28 NOTE — Patient Instructions (Signed)
Hold coumadin tonight then decrease dose to 1 1/2 tablets daily except 1 tablet on Mondays, Wednesdays and Fridays Recheck in 3 wks

## 2019-01-19 ENCOUNTER — Ambulatory Visit (INDEPENDENT_AMBULATORY_CARE_PROVIDER_SITE_OTHER): Payer: BC Managed Care – PPO | Admitting: *Deleted

## 2019-01-19 ENCOUNTER — Other Ambulatory Visit: Payer: Self-pay

## 2019-01-19 DIAGNOSIS — Z952 Presence of prosthetic heart valve: Secondary | ICD-10-CM

## 2019-01-19 DIAGNOSIS — Z5181 Encounter for therapeutic drug level monitoring: Secondary | ICD-10-CM | POA: Diagnosis not present

## 2019-01-19 LAB — POCT INR: INR: 1.5 — AB (ref 2.0–3.0)

## 2019-01-19 NOTE — Patient Instructions (Signed)
Increase warfarin to 1 1/2 tablets daily except 1 tablet on Wednesdays  Recheck in 2 wks

## 2019-02-02 ENCOUNTER — Ambulatory Visit (INDEPENDENT_AMBULATORY_CARE_PROVIDER_SITE_OTHER): Payer: BC Managed Care – PPO | Admitting: *Deleted

## 2019-02-02 ENCOUNTER — Other Ambulatory Visit: Payer: Self-pay

## 2019-02-02 DIAGNOSIS — Z952 Presence of prosthetic heart valve: Secondary | ICD-10-CM | POA: Diagnosis not present

## 2019-02-02 DIAGNOSIS — Z5181 Encounter for therapeutic drug level monitoring: Secondary | ICD-10-CM | POA: Diagnosis not present

## 2019-02-02 LAB — POCT INR: INR: 2 (ref 2.0–3.0)

## 2019-02-02 NOTE — Patient Instructions (Signed)
Increase warfarin to 1 1/2 tablets daily Recheck in 3 wks 

## 2019-02-23 ENCOUNTER — Ambulatory Visit (INDEPENDENT_AMBULATORY_CARE_PROVIDER_SITE_OTHER): Payer: BC Managed Care – PPO | Admitting: *Deleted

## 2019-02-23 ENCOUNTER — Other Ambulatory Visit: Payer: Self-pay

## 2019-02-23 DIAGNOSIS — Z952 Presence of prosthetic heart valve: Secondary | ICD-10-CM | POA: Diagnosis not present

## 2019-02-23 DIAGNOSIS — Z5181 Encounter for therapeutic drug level monitoring: Secondary | ICD-10-CM | POA: Diagnosis not present

## 2019-02-23 LAB — POCT INR: INR: 2.1 (ref 2.0–3.0)

## 2019-02-23 NOTE — Patient Instructions (Signed)
Continue warfarin 1 1/2 tablets daily. Recheck in 4 wks  

## 2019-03-23 ENCOUNTER — Other Ambulatory Visit: Payer: Self-pay

## 2019-03-23 ENCOUNTER — Ambulatory Visit (INDEPENDENT_AMBULATORY_CARE_PROVIDER_SITE_OTHER): Payer: BC Managed Care – PPO | Admitting: *Deleted

## 2019-03-23 DIAGNOSIS — Z952 Presence of prosthetic heart valve: Secondary | ICD-10-CM | POA: Diagnosis not present

## 2019-03-23 DIAGNOSIS — Z5181 Encounter for therapeutic drug level monitoring: Secondary | ICD-10-CM | POA: Diagnosis not present

## 2019-03-23 LAB — POCT INR: INR: 1.7 — AB (ref 2.0–3.0)

## 2019-03-23 NOTE — Patient Instructions (Signed)
Increase warfarin to 1 1/2 tablets daily except 2 tablets on Mondays and Thursdays Recheck in 3 wks

## 2019-04-13 ENCOUNTER — Other Ambulatory Visit: Payer: Self-pay

## 2019-04-13 ENCOUNTER — Ambulatory Visit (INDEPENDENT_AMBULATORY_CARE_PROVIDER_SITE_OTHER): Payer: BC Managed Care – PPO | Admitting: *Deleted

## 2019-04-13 DIAGNOSIS — Z952 Presence of prosthetic heart valve: Secondary | ICD-10-CM | POA: Diagnosis not present

## 2019-04-13 DIAGNOSIS — Z5181 Encounter for therapeutic drug level monitoring: Secondary | ICD-10-CM | POA: Diagnosis not present

## 2019-04-13 LAB — POCT INR: INR: 3.9 — AB (ref 2.0–3.0)

## 2019-04-13 MED ORDER — LISINOPRIL 2.5 MG PO TABS: 2.5000 mg | ORAL_TABLET | Freq: Every day | ORAL | 3 refills | Status: DC

## 2019-04-13 NOTE — Patient Instructions (Signed)
Hold warfarin tonight then decrease dose to 1 1/2 tablets daily.   Recheck in 3 wks 

## 2019-05-09 ENCOUNTER — Ambulatory Visit (INDEPENDENT_AMBULATORY_CARE_PROVIDER_SITE_OTHER): Payer: BC Managed Care – PPO | Admitting: *Deleted

## 2019-05-09 DIAGNOSIS — Z5181 Encounter for therapeutic drug level monitoring: Secondary | ICD-10-CM | POA: Diagnosis not present

## 2019-05-09 DIAGNOSIS — Z952 Presence of prosthetic heart valve: Secondary | ICD-10-CM

## 2019-05-09 LAB — POCT INR: INR: 3.1 — AB (ref 2.0–3.0)

## 2019-05-09 NOTE — Patient Instructions (Signed)
Continue warfarin 1 1/2 tablets daily. Recheck in 4 wks  

## 2019-06-06 ENCOUNTER — Ambulatory Visit (INDEPENDENT_AMBULATORY_CARE_PROVIDER_SITE_OTHER): Payer: BC Managed Care – PPO | Admitting: *Deleted

## 2019-06-06 ENCOUNTER — Other Ambulatory Visit: Payer: Self-pay

## 2019-06-06 DIAGNOSIS — Z5181 Encounter for therapeutic drug level monitoring: Secondary | ICD-10-CM

## 2019-06-06 DIAGNOSIS — Z952 Presence of prosthetic heart valve: Secondary | ICD-10-CM | POA: Diagnosis not present

## 2019-06-06 LAB — POCT INR: INR: 3.1 — AB (ref 2.0–3.0)

## 2019-06-06 NOTE — Patient Instructions (Signed)
Continue warfarin 1 1/2 tablets daily. Recheck in 4 wks  

## 2019-06-20 ENCOUNTER — Other Ambulatory Visit: Payer: Self-pay | Admitting: *Deleted

## 2019-06-20 ENCOUNTER — Telehealth: Payer: Self-pay | Admitting: *Deleted

## 2019-06-20 MED ORDER — METOPROLOL SUCCINATE ER 100 MG PO TB24: ORAL_TABLET | ORAL | 1 refills | Status: DC

## 2019-06-20 NOTE — Telephone Encounter (Addendum)
Pt called.  Was started on Augmentin 2 x day x 10 days.  Told pt to decrease warfarin to 1 1/2 tablets daily except 1 tablet on Tuesday and Friday till finished antibiotic then resume 1 1/2 tablets daily.  Keep INR appt on 07/04/19.   He verbalized understanding.

## 2019-07-04 ENCOUNTER — Other Ambulatory Visit: Payer: Self-pay

## 2019-07-04 ENCOUNTER — Ambulatory Visit (INDEPENDENT_AMBULATORY_CARE_PROVIDER_SITE_OTHER): Payer: BC Managed Care – PPO | Admitting: *Deleted

## 2019-07-04 DIAGNOSIS — Z5181 Encounter for therapeutic drug level monitoring: Secondary | ICD-10-CM | POA: Diagnosis not present

## 2019-07-04 DIAGNOSIS — Z952 Presence of prosthetic heart valve: Secondary | ICD-10-CM

## 2019-07-04 LAB — POCT INR: INR: 2.1 (ref 2.0–3.0)

## 2019-07-04 NOTE — Patient Instructions (Signed)
Continue warfarin 1 1/2 tablets daily. Recheck in 4 wks  

## 2019-08-03 ENCOUNTER — Ambulatory Visit (INDEPENDENT_AMBULATORY_CARE_PROVIDER_SITE_OTHER): Payer: BC Managed Care – PPO

## 2019-08-03 ENCOUNTER — Other Ambulatory Visit: Payer: Self-pay

## 2019-08-03 DIAGNOSIS — Z952 Presence of prosthetic heart valve: Secondary | ICD-10-CM | POA: Diagnosis not present

## 2019-08-03 DIAGNOSIS — Z5181 Encounter for therapeutic drug level monitoring: Secondary | ICD-10-CM | POA: Diagnosis not present

## 2019-08-03 LAB — POCT INR: INR: 3.3 — AB (ref 2.0–3.0)

## 2019-08-03 MED ORDER — WARFARIN SODIUM 5 MG PO TABS: ORAL_TABLET | ORAL | 3 refills | Status: DC

## 2019-08-03 NOTE — Patient Instructions (Signed)
Description   Skip today's dosage of Warfarin, then resume same dosage 1.5 tablets daily  Recheck in 4 weeks

## 2019-08-31 ENCOUNTER — Ambulatory Visit (INDEPENDENT_AMBULATORY_CARE_PROVIDER_SITE_OTHER): Payer: BC Managed Care – PPO | Admitting: *Deleted

## 2019-08-31 ENCOUNTER — Other Ambulatory Visit: Payer: Self-pay

## 2019-08-31 DIAGNOSIS — Z952 Presence of prosthetic heart valve: Secondary | ICD-10-CM | POA: Diagnosis not present

## 2019-08-31 DIAGNOSIS — Z5181 Encounter for therapeutic drug level monitoring: Secondary | ICD-10-CM

## 2019-08-31 LAB — POCT INR: INR: 2.4 (ref 2.0–3.0)

## 2019-08-31 NOTE — Patient Instructions (Signed)
Continue warfarin 1.5 tablets daily  Recheck in 4 weeks

## 2019-09-28 ENCOUNTER — Ambulatory Visit (INDEPENDENT_AMBULATORY_CARE_PROVIDER_SITE_OTHER): Payer: BC Managed Care – PPO | Admitting: *Deleted

## 2019-09-28 ENCOUNTER — Other Ambulatory Visit: Payer: Self-pay

## 2019-09-28 DIAGNOSIS — Z952 Presence of prosthetic heart valve: Secondary | ICD-10-CM

## 2019-09-28 DIAGNOSIS — Z5181 Encounter for therapeutic drug level monitoring: Secondary | ICD-10-CM

## 2019-09-28 LAB — POCT INR: INR: 2.7 (ref 2.0–3.0)

## 2019-09-28 NOTE — Patient Instructions (Signed)
Continue warfarin 1.5 tablets daily  Recheck in 6 weeks  

## 2019-11-09 ENCOUNTER — Ambulatory Visit (INDEPENDENT_AMBULATORY_CARE_PROVIDER_SITE_OTHER): Payer: BC Managed Care – PPO | Admitting: *Deleted

## 2019-11-09 DIAGNOSIS — Z952 Presence of prosthetic heart valve: Secondary | ICD-10-CM | POA: Diagnosis not present

## 2019-11-09 DIAGNOSIS — Z5181 Encounter for therapeutic drug level monitoring: Secondary | ICD-10-CM | POA: Diagnosis not present

## 2019-11-09 LAB — POCT INR: INR: 2.9 (ref 2.0–3.0)

## 2019-11-09 NOTE — Patient Instructions (Signed)
Continue warfarin 1.5 tablets daily  Recheck in 6 weeks

## 2019-12-18 ENCOUNTER — Other Ambulatory Visit: Payer: Self-pay | Admitting: *Deleted

## 2019-12-18 ENCOUNTER — Other Ambulatory Visit: Payer: Self-pay

## 2019-12-18 MED ORDER — WARFARIN SODIUM 5 MG PO TABS: ORAL_TABLET | ORAL | 3 refills | Status: DC

## 2019-12-18 MED ORDER — METOPROLOL SUCCINATE ER 100 MG PO TB24: ORAL_TABLET | ORAL | 1 refills | Status: DC

## 2019-12-18 NOTE — Telephone Encounter (Signed)
Received faxed refill request from [facility] office for Warfarin from [facility]. Refill sent in electronically.

## 2019-12-21 ENCOUNTER — Other Ambulatory Visit: Payer: Self-pay

## 2019-12-21 ENCOUNTER — Ambulatory Visit (INDEPENDENT_AMBULATORY_CARE_PROVIDER_SITE_OTHER): Payer: BC Managed Care – PPO | Admitting: *Deleted

## 2019-12-21 DIAGNOSIS — Z952 Presence of prosthetic heart valve: Secondary | ICD-10-CM | POA: Diagnosis not present

## 2019-12-21 DIAGNOSIS — Z5181 Encounter for therapeutic drug level monitoring: Secondary | ICD-10-CM | POA: Diagnosis not present

## 2019-12-21 LAB — POCT INR: INR: 3.2 — AB (ref 2.0–3.0)

## 2019-12-21 NOTE — Patient Instructions (Signed)
Take warfarin 1/2 tablet tonight then resume 1 1/2  tablets daily Recheck in 6 weeks 

## 2020-01-16 ENCOUNTER — Encounter: Payer: Self-pay | Admitting: *Deleted

## 2020-01-16 ENCOUNTER — Ambulatory Visit: Payer: BC Managed Care – PPO | Admitting: Cardiology

## 2020-01-16 ENCOUNTER — Encounter: Payer: Self-pay | Admitting: Cardiology

## 2020-01-16 VITALS — BP 150/90 | HR 56 | Ht 67.0 in | Wt 226.8 lb

## 2020-01-16 DIAGNOSIS — Z952 Presence of prosthetic heart valve: Secondary | ICD-10-CM | POA: Diagnosis not present

## 2020-01-16 DIAGNOSIS — I351 Nonrheumatic aortic (valve) insufficiency: Secondary | ICD-10-CM

## 2020-01-16 DIAGNOSIS — Z8679 Personal history of other diseases of the circulatory system: Secondary | ICD-10-CM

## 2020-01-16 DIAGNOSIS — I10 Essential (primary) hypertension: Secondary | ICD-10-CM

## 2020-01-16 NOTE — Patient Instructions (Signed)

## 2020-01-16 NOTE — Progress Notes (Signed)
Clinical Summary Vincent [name] is a 59 y.o.male seen today for follow up of the following medical problems.   1. 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   2. HTN - compliant with meds - home bp's 130s/60s - has had about 18 lbs weight gain since last year  Past Medical History:  Diagnosis Date  . Aortic root aneurysm (HCC)   . Chronic systolic heart failure (HCC)      Allergies  Allergen Reactions  . Latex Rash     Current Outpatient Medications  Medication Sig Dispense Refill  . acetaminophen (TYLENOL) 500 MG tablet Take 500 mg by mouth every 6 (six) hours as needed.    Marland Kitchen aspirin EC 81 MG tablet Take 1 tablet (81 mg total) by mouth daily.    Marland Kitchen lisinopril (ZESTRIL) 2.5 MG tablet Take 1 tablet (2.5 mg total) by mouth daily. 90 tablet 3  . metoprolol succinate (TOPROL-XL) 100 MG 24 hr tablet Take 100 mg in morning and 50 mg in evening 135 tablet 1  . warfarin (COUMADIN) 5 MG tablet Take as directed by anticoagulation clinic 50 tablet 3   No current facility-administered medications for this visit.     Past Surgical History:  Procedure Laterality Date  . BENTALL PROCEDURE N/A 04/13/2014   Procedure: BENTALL PROCEDURE;  Surgeon: Alleen Borne, MD;  Location: [facility] OR;  Service: Open Heart Surgery;  Laterality: N/A;  . LEFT AND RIGHT HEART CATHETERIZATION WITH CORONARY ANGIOGRAM N/A 04/12/2014   Procedure: LEFT AND RIGHT HEART CATHETERIZATION WITH CORONARY ANGIOGRAM;  Surgeon: Lesleigh Noe, MD;  Location: [facility] CATH LAB;  Service: Cardiovascular;  Laterality: N/A;  . TEE WITHOUT CARDIOVERSION N/A 04/13/2014   Procedure: TRANSESOPHAGEAL ECHOCARDIOGRAM (TEE);  Surgeon: Alleen Borne,  MD;  Location: [facility] OR;  Service: Open Heart Surgery;  Laterality: N/A;     Allergies  Allergen Reactions  . Latex Rash      Family History  Problem Relation Age of Onset  . Diabetes Father   . Diabetes Sister      Social History [name] reports that he quit smoking about 26 years ago. His smoking use included cigarettes. He started smoking about 38 years ago. He has a 2.50 pack-year smoking history. He quit smokeless tobacco use about 44 years ago.  His smokeless tobacco use included chew. [name] reports no history of alcohol use.   Review of Systems CONSTITUTIONAL: No weight loss, fever, chills, weakness or fatigue.  HEENT: Eyes: No visual loss, blurred vision, double vision or yellow sclerae.No hearing loss, sneezing, congestion, runny nose or sore throat.  SKIN: No rash or itching.  CARDIOVASCULAR: per hpi RESPIRATORY: No shortness of breath, cough or sputum.  GASTROINTESTINAL: No anorexia, nausea, vomiting or diarrhea. No abdominal pain or blood.  GENITOURINARY: No burning on urination, no polyuria NEUROLOGICAL: No headache, dizziness, syncope, paralysis, ataxia, numbness or tingling in the extremities. No change in bowel or bladder control.  MUSCULOSKELETAL: No muscle, back pain, joint pain or stiffness.  LYMPHATICS: No enlarged nodes. No history of splenectomy.  PSYCHIATRIC: No history of depression or anxiety.  ENDOCRINOLOGIC: No reports of sweating, cold or heat intolerance. No  polyuria or polydipsia.  Marland Kitchen   Physical Examination Today's Vitals   01/16/20 0811  BP: (!) 150/90  Pulse: (!) 56  SpO2: 98%  Weight: 226 lb 12.8 oz (102.9 kg)  Height: 5\' 7"  (1.702 m)   Body mass index is 35.52 kg/m.;  Gen: resting comfortably, no acute distress HEENT: no scleral icterus, pupils equal round and reactive, no palptable cervical adenopathy,  CV: RRR, mechanical S2, no m/r/g, no jvd Resp: Clear to auscultation bilaterally GI: abdomen is soft, non-tender,  non-distended, normal bowel sounds, no hepatosplenomegaly MSK: extremities are warm, no edema.  Skin: warm, no rash Neuro:  no focal deficits Psych: appropriate affect   Diagnostic Studies 04/10/14 Echo Study Conclusions  - Left ventricle: The cavity size was normal. Wall thickness was normal. Systolic function was normal. The estimated ejection fraction was in the range of 50% to 55%. The study is not technically sufficient to allow evaluation of LV diastolic function. - Aortic valve: Uncertain number of leaflets, cannot rule out AV bicuspid valve. There was severe regurgitation. The AR vena contracta is 0.8 cm. Valve area (VTI): 3.31 cm^2. Valve area (Vmax): 3.11 cm^2. Valve area (Vmean): 2.83 cm^2. - Aorta: Aortic root dimension: 75 mm (ED). - Aortic root: The aortic root was severely dilated. The dilatation is primarily of the right Sinus of valsalva. The sinotubular junction, proximal ascending aorta, and aortic arch are poorly visualized, cannot evaluate if aneurysmal. - Mitral valve: There was mild regurgitation. - Left atrium: The atrium was severely dilated. - Right ventricle: The cavity size was mildly dilated. - Right atrium: The atrium was moderately dilated. - Atrial septum: No defect or patent foramen ovale was identified. - Pulmonary arteries: Systolic pressure was moderately increased. PA peak pressure: 50 mm Hg (S). - Technically difficult study.  04/21/14 Echo Study Conclusions  - Left ventricle: The cavity size was normal. Wall thickness was normal. Systolic function was moderately reduced. The estimated ejection fraction was in the range of 35% to 40%. Diffuse hypokinesis. - Aortic valve: A mechanical prosthesis was present and functioning normally. Peak velocity (S): 230 cm/s. - Aorta: Aortic root repair intact. - Right ventricle: Systolic function was moderately reduced. - Right atrium: The atrium was mildly dilated. -  Pericardium, extracardiac: There was no pericardial effusion.  Impressions:  - When compared to prior evaluation, EF has decreased.   Jan 2016 Cath  HEMODYNAMICS: Aortic pressure 109/59 mmHg; LV pressure 105/10 mmHg; LVEDP 23 mmHg; RA 8 mmHg; RV 46/11 mmHg; PA 50/23 mmHg; PCWP(mean) 23 mmHg; Cardiac Output 6.24 L/m; AV gradient none  ANGIOGRAPHIC DATA: The left main coronary artery is normal.  The left anterior descending artery is transapical and widely patent. There are mid and distal luminal irregularities and there is some systolic compression in the mid LAD suggesting possible intramyocardial course. 2 large diagonals arise from the proximal to mid LAD.  The left circumflex artery is large and widely patent. The circumflex is dominant..  The right coronary artery is nondominant. No obstruction is noted..  ASCENDING AORTOGRAPHY:Markedly dilated aorta due to right coronary sinus of Valsalva aneurysm. There is accompanying 3+ aortic regurgitation. There also appears to be mild enlargement of the ascending aorta.  LEFT VENTRICULOGRAM:Determined by regurgitation from aorta during aortography) in the 20 RAO projection and revealed of normal to mildly dilated LV cavity size with ejection fraction of 45-50%.   IMPRESSIONS: 1. Right coronary sinus of Valsalva aneurysm 2. Significant aortic regurgitation (3+)by aortography 3. Widely patent coronary arteries 4. Low normal  to mildly depressed left ventricular systolic function in the 45 to 50% range.   08/2014 Echo Study Conclusions  - Left ventricle: The cavity size was normal. Wall thickness was normal. Systolic function was normal. The estimated ejection fraction was in the range of 60% to 65%. Wall motion was normal; there were no regional wall motion abnormalities. Left ventricular diastolic function parameters were normal. - Aortic valve: There is a 27 mm ST Jude mechanical valve conduit in the  AV/aortic root position. Normal functional mechanical AV. There was no stenosis. There was no significant regurgitation. Valve area (VTI): 2.03 cm^2. Valve area (Vmax): 2.06 cm^2. Valve area (Vmean): 1.95 cm^2. - Aortic root: The aortic root was normal in size. There is a known aortic conduit at the level of the aortic root. - Left atrium: The atrium was mildly dilated. - Atrial septum: No defect or patent foramen ovale was identified. - Technically adequate study.    Assessment and Plan  1. Aortic aneurysm with severe AI -s/p 27 mm ST Jude mechanical valve conduit (Bentall procedure) Apr 13, 2014 - no recent symptoms, cotninue to monitor - conintue ASA/coumadin in setting of mechanical valve  2. HTN - manual recheck 140/80, home bp's have been at goal. Likely some uptrend due to weight gain - he will continue to monitor at home, contact us if consistently above 130/80. Room to titrate lisinopril if needed   EKG today shows sinus brady 52, no symptoms.   F/u 1 year       Antoine Poche, M.D.

## 2020-02-01 ENCOUNTER — Ambulatory Visit (INDEPENDENT_AMBULATORY_CARE_PROVIDER_SITE_OTHER): Payer: BC Managed Care – PPO | Admitting: *Deleted

## 2020-02-01 ENCOUNTER — Telehealth: Payer: Self-pay | Admitting: *Deleted

## 2020-02-01 DIAGNOSIS — Z952 Presence of prosthetic heart valve: Secondary | ICD-10-CM | POA: Diagnosis not present

## 2020-02-01 DIAGNOSIS — Z5181 Encounter for therapeutic drug level monitoring: Secondary | ICD-10-CM | POA: Diagnosis not present

## 2020-02-01 LAB — POCT INR: INR: 3.3 — AB (ref 2.0–3.0)

## 2020-02-01 NOTE — Patient Instructions (Signed)
Hold warfarin tonight then decrease dose to 1 1/2 tablets daily except 1 tablet on Mondays and Thursdays Recheck in 5 weeks

## 2020-02-01 NOTE — Telephone Encounter (Signed)
Have called pt 3 times today with no answer.  Have left message on voice mail for pt to call back.

## 2020-02-01 NOTE — Telephone Encounter (Signed)
Mr. On called requesting to speak with Vashti Hey, RN

## 2020-03-11 ENCOUNTER — Ambulatory Visit (INDEPENDENT_AMBULATORY_CARE_PROVIDER_SITE_OTHER): Payer: BC Managed Care – PPO | Admitting: *Deleted

## 2020-03-11 DIAGNOSIS — Z952 Presence of prosthetic heart valve: Secondary | ICD-10-CM

## 2020-03-11 DIAGNOSIS — Z5181 Encounter for therapeutic drug level monitoring: Secondary | ICD-10-CM | POA: Diagnosis not present

## 2020-03-11 LAB — POCT INR: INR: 3.1 — AB (ref 2.0–3.0)

## 2020-03-11 NOTE — Patient Instructions (Signed)
Continue warfarin 1 1/2 tablets daily except 1 tablet on Mondays and Thursdays Increase greens Recheck in 5 weeks

## 2020-04-15 ENCOUNTER — Ambulatory Visit (INDEPENDENT_AMBULATORY_CARE_PROVIDER_SITE_OTHER): Payer: BC Managed Care – PPO | Admitting: *Deleted

## 2020-04-15 DIAGNOSIS — Z952 Presence of prosthetic heart valve: Secondary | ICD-10-CM | POA: Diagnosis not present

## 2020-04-15 DIAGNOSIS — Z5181 Encounter for therapeutic drug level monitoring: Secondary | ICD-10-CM | POA: Diagnosis not present

## 2020-04-15 LAB — POCT INR: INR: 4.1 — AB (ref 2.0–3.0)

## 2020-04-15 NOTE — Patient Instructions (Signed)
Hold warfarin tonight then decrease dose to 1 tablet daily except 1 1/2 tablets on Sundays, Tuesdays and Thursdays Increase greens Recheck in 3 weeks

## 2020-04-17 ENCOUNTER — Other Ambulatory Visit: Payer: Self-pay | Admitting: *Deleted

## 2020-04-17 MED ORDER — LISINOPRIL 2.5 MG PO TABS: 2.5000 mg | ORAL_TABLET | Freq: Every day | ORAL | 3 refills | Status: DC

## 2020-05-06 ENCOUNTER — Ambulatory Visit (INDEPENDENT_AMBULATORY_CARE_PROVIDER_SITE_OTHER): Payer: BC Managed Care – PPO | Admitting: *Deleted

## 2020-05-06 DIAGNOSIS — Z952 Presence of prosthetic heart valve: Secondary | ICD-10-CM

## 2020-05-06 DIAGNOSIS — Z5181 Encounter for therapeutic drug level monitoring: Secondary | ICD-10-CM | POA: Diagnosis not present

## 2020-05-06 LAB — POCT INR: INR: 1.5 — AB (ref 2.0–3.0)

## 2020-05-06 NOTE — Patient Instructions (Signed)
Take warfarin 2 tablets today and tomorrow then resume 1 tablet daily except 1 1/2 tablets on Sundays, Tuesdays and Thursdays Increase greens Recheck in 2 weeks

## 2020-05-15 ENCOUNTER — Telehealth: Payer: Self-pay | Admitting: *Deleted

## 2020-05-15 MED ORDER — WARFARIN SODIUM 5 MG PO TABS: ORAL_TABLET | ORAL | 6 refills | Status: DC

## 2020-05-15 NOTE — Telephone Encounter (Signed)
Needing refill on warfarin (COUMADIN) 5 MG tablet [[id number]]  Sent to [facility] in [place]

## 2020-05-20 ENCOUNTER — Ambulatory Visit (INDEPENDENT_AMBULATORY_CARE_PROVIDER_SITE_OTHER): Payer: BC Managed Care – PPO | Admitting: *Deleted

## 2020-05-20 DIAGNOSIS — Z5181 Encounter for therapeutic drug level monitoring: Secondary | ICD-10-CM | POA: Diagnosis not present

## 2020-05-20 DIAGNOSIS — Z952 Presence of prosthetic heart valve: Secondary | ICD-10-CM

## 2020-05-20 LAB — POCT INR: INR: 1.8 — AB (ref 2.0–3.0)

## 2020-05-20 NOTE — Patient Instructions (Signed)
Increase warfarin to 1 1/2 tablets daily except 1 tablet on Sundays and Wednesdays Continue greens Recheck in 3 weeks

## 2020-06-10 ENCOUNTER — Ambulatory Visit (INDEPENDENT_AMBULATORY_CARE_PROVIDER_SITE_OTHER): Payer: BC Managed Care – PPO | Admitting: *Deleted

## 2020-06-10 ENCOUNTER — Other Ambulatory Visit: Payer: Self-pay

## 2020-06-10 DIAGNOSIS — Z5181 Encounter for therapeutic drug level monitoring: Secondary | ICD-10-CM

## 2020-06-10 DIAGNOSIS — Z952 Presence of prosthetic heart valve: Secondary | ICD-10-CM | POA: Diagnosis not present

## 2020-06-10 LAB — POCT INR: INR: 2.6 (ref 2.0–3.0)

## 2020-06-10 NOTE — Patient Instructions (Signed)
Continue warfarin 1 1/2 tablets daily except 1 tablet on Sundays and Wednesdays Continue greens Recheck in 4 weeks  

## 2020-07-08 ENCOUNTER — Ambulatory Visit (INDEPENDENT_AMBULATORY_CARE_PROVIDER_SITE_OTHER): Payer: BC Managed Care – PPO | Admitting: *Deleted

## 2020-07-08 DIAGNOSIS — Z952 Presence of prosthetic heart valve: Secondary | ICD-10-CM

## 2020-07-08 DIAGNOSIS — Z5181 Encounter for therapeutic drug level monitoring: Secondary | ICD-10-CM | POA: Diagnosis not present

## 2020-07-08 LAB — POCT INR: INR: 3.2 — AB (ref 2.0–3.0)

## 2020-07-08 NOTE — Patient Instructions (Signed)
Take warfarin 1/2 tablet tonight then resume 1 1/2 tablets daily except 1 tablet on Sundays and Wednesdays Continue greens Recheck in 4 weeks

## 2020-07-11 ENCOUNTER — Telehealth: Payer: Self-pay | Admitting: Cardiology

## 2020-07-11 MED ORDER — METOPROLOL SUCCINATE ER 100 MG PO TB24: ORAL_TABLET | ORAL | 3 refills | Status: DC

## 2020-07-11 NOTE — Telephone Encounter (Signed)
New message     *STAT* If patient is at the pharmacy, call can be transferred to refill team.   1. Which medications need to be refilled? (please list name of each medication and dose if known) Toprol  2. Which pharmacy/location (including street and city if local pharmacy) is medication to be sent to? [facility] [place]  3. Do they need a 30 day or 90 day supply? 90

## 2020-07-11 NOTE — Telephone Encounter (Signed)
Refilled per request.

## 2020-08-05 ENCOUNTER — Other Ambulatory Visit: Payer: Self-pay

## 2020-08-05 ENCOUNTER — Ambulatory Visit (INDEPENDENT_AMBULATORY_CARE_PROVIDER_SITE_OTHER): Payer: BC Managed Care – PPO | Admitting: *Deleted

## 2020-08-05 DIAGNOSIS — Z5181 Encounter for therapeutic drug level monitoring: Secondary | ICD-10-CM | POA: Diagnosis not present

## 2020-08-05 DIAGNOSIS — Z952 Presence of prosthetic heart valve: Secondary | ICD-10-CM

## 2020-08-05 LAB — POCT INR: INR: 2.4 (ref 2.0–3.0)

## 2020-08-05 NOTE — Patient Instructions (Signed)
Continue warfarin 1 1/2 tablets daily except 1 tablet on Sundays and Wednesdays Continue greens Recheck in 4 weeks  

## 2020-09-04 ENCOUNTER — Ambulatory Visit (INDEPENDENT_AMBULATORY_CARE_PROVIDER_SITE_OTHER): Payer: BC Managed Care – PPO | Admitting: *Deleted

## 2020-09-04 DIAGNOSIS — Z5181 Encounter for therapeutic drug level monitoring: Secondary | ICD-10-CM

## 2020-09-04 DIAGNOSIS — Z952 Presence of prosthetic heart valve: Secondary | ICD-10-CM

## 2020-09-04 LAB — POCT INR: INR: 3 (ref 2.0–3.0)

## 2020-09-04 NOTE — Patient Instructions (Signed)
Continue warfarin 1 1/2 tablets daily except 1 tablet on Sundays and Wednesdays Continue greens Recheck in 6 weeks  

## 2020-10-16 ENCOUNTER — Ambulatory Visit (INDEPENDENT_AMBULATORY_CARE_PROVIDER_SITE_OTHER): Payer: BC Managed Care – PPO | Admitting: *Deleted

## 2020-10-16 DIAGNOSIS — Z952 Presence of prosthetic heart valve: Secondary | ICD-10-CM

## 2020-10-16 DIAGNOSIS — Z5181 Encounter for therapeutic drug level monitoring: Secondary | ICD-10-CM | POA: Diagnosis not present

## 2020-10-16 LAB — POCT INR: INR: 2.4 (ref 2.0–3.0)

## 2020-10-16 NOTE — Patient Instructions (Signed)
Continue warfarin 1 1/2 tablets daily except 1 tablet on Sundays and Wednesdays Continue greens Recheck in 6 weeks  

## 2020-11-28 ENCOUNTER — Ambulatory Visit (INDEPENDENT_AMBULATORY_CARE_PROVIDER_SITE_OTHER): Payer: BC Managed Care – PPO | Admitting: *Deleted

## 2020-11-28 ENCOUNTER — Other Ambulatory Visit: Payer: Self-pay

## 2020-11-28 DIAGNOSIS — Z5181 Encounter for therapeutic drug level monitoring: Secondary | ICD-10-CM | POA: Diagnosis not present

## 2020-11-28 DIAGNOSIS — Z952 Presence of prosthetic heart valve: Secondary | ICD-10-CM

## 2020-11-28 LAB — POCT INR: INR: 2.6 (ref 2.0–3.0)

## 2020-11-28 NOTE — Patient Instructions (Signed)
Description   Continue warfarin 1 1/2 tablets daily except 1 tablet on Sundays and Wednesdays Continue greens Recheck in 6 weeks

## 2021-01-09 ENCOUNTER — Other Ambulatory Visit: Payer: Self-pay

## 2021-01-09 ENCOUNTER — Ambulatory Visit (INDEPENDENT_AMBULATORY_CARE_PROVIDER_SITE_OTHER): Payer: BC Managed Care – PPO | Admitting: *Deleted

## 2021-01-09 DIAGNOSIS — Z5181 Encounter for therapeutic drug level monitoring: Secondary | ICD-10-CM

## 2021-01-09 DIAGNOSIS — Z952 Presence of prosthetic heart valve: Secondary | ICD-10-CM

## 2021-01-09 LAB — POCT INR: INR: 2.4 (ref 2.0–3.0)

## 2021-01-09 MED ORDER — WARFARIN SODIUM 5 MG PO TABS: ORAL_TABLET | ORAL | 6 refills | Status: DC

## 2021-01-09 NOTE — Patient Instructions (Signed)
Continue warfarin 1 1/2 tablets daily except 1 tablet on Sundays and Wednesdays Continue greens Recheck in 6 weeks  

## 2021-02-19 NOTE — Progress Notes (Signed)
Cardiology Office Note  Date: 02/20/2021   ID: Vincent [name], DOB 12-Dec-1960, MRN [id number]  PCP:  Truddie Coco, FNP  Cardiologist:  Dina Rich, MD Electrophysiologist:  None   Chief Complaint: 1 year follow-up  History of Present Illness: Vincent [name] is a 60 y.o. male with a history of severe aortic regurgitation, aortic root dilatation, cardiomegaly, pleural effusion, shortness of breath, tachycardia, status post aortic valve replacement.  He was last seen by Dr. Wyline Mood on 01/16/2020.  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  Status post 27 mm Saint Jude mechanical valve conduit, Bentall procedure April 13, 2014.  Echocardiogram on May 2016 normal functioning mechanical aortic valve replacement.  During visit he complained of low shortness of breath or DOE.  No bleeding on Coumadin, no lower extremity edema.  He was continuing aspirin.  He was compliant with his blood pressure medications.  Home blood pressures 130s over 160s.  He had experienced an 18 pound weight gain since the prior year.  Plan was to continue aspirin and Coumadin in setting of mechanical valve.  Blood pressure on manual recheck in the office was 140 over 80s.  Home blood pressures had been at goal.  Plan was to continue to monitor blood pressures at home and contact the office if blood pressures consistently above 130/80.  There was room to titrate lisinopril if needed.  EKG reviewed during that visit showed sinus bradycardia with a rate of 52 without symptoms.  He is here today for 1 year follow-up.  States she has been doing well.  Denies any acute illnesses or hospitalizations.  Denies any anginal or exertional symptoms, palpitations or arrhythmias, orthostatic symptoms, CVA or TIA-like symptoms, DOE/SOB, PND, orthopnea, bleeding on Coumadin, claudication-like symptoms, DVT or PE-like symptoms, or lower extremity edema.  He states his  primary care provider recently retired and he will be establishing with a new physician in [place] and will let us know who his new provider is when he establishes.  He denies any changes in since medical therapy.  His current cardiac regimen includes aspirin 81 mg p.o. daily, lisinopril 2.5 mg p.o. daily, Toprol-XL 100 mg in a.m. and 50 in p.m. Coumadin as directed by Coumadin clinic.  Blood pressure is well controlled today at 130/80 with a heart rate of 52.  EKG today shows sinus bradycardia with a rate of 53, incomplete RBBB, LAFB.  Past Medical History:  Diagnosis Date   Aortic root aneurysm    Chronic systolic heart failure [facility])     Past Surgical History:  Procedure Laterality Date   BENTALL PROCEDURE N/A 04/13/2014   Procedure: BENTALL PROCEDURE;  Surgeon: Alleen Borne, MD;  Location: [facility] OR;  Service: Open Heart Surgery;  Laterality: N/A;   LEFT AND RIGHT HEART CATHETERIZATION WITH CORONARY ANGIOGRAM N/A 04/12/2014   Procedure: LEFT AND RIGHT HEART CATHETERIZATION WITH CORONARY ANGIOGRAM;  Surgeon: Lesleigh Noe, MD;  Location: [facility] CATH LAB;  Service: Cardiovascular;  Laterality: N/A;   TEE WITHOUT CARDIOVERSION N/A 04/13/2014   Procedure: TRANSESOPHAGEAL ECHOCARDIOGRAM (TEE);  Surgeon: Alleen Borne, MD;  Location: [facility] OR;  Service: Open Heart Surgery;  Laterality: N/A;    Current Outpatient Medications  Medication Sig Dispense Refill   acetaminophen (TYLENOL) 500 MG tablet Take 500 mg by mouth every 6 (six) hours as needed.     aspirin EC 81 MG tablet Take 1 tablet (81 mg total) by  mouth daily.     lisinopril (ZESTRIL) 2.5 MG tablet Take 1 tablet (2.5 mg total) by mouth daily. 90 tablet 3   metoprolol succinate (TOPROL-XL) 100 MG 24 hr tablet Take 100 mg in morning and 50 mg in evening 135 tablet 3   warfarin (COUMADIN) 5 MG tablet Take as directed by anticoagulation clinic 50 tablet 6   No current facility-administered medications for this visit.   Allergies:  Latex    Social History: The patient  reports that he quit smoking about 27 years ago. His smoking use included cigarettes. He started smoking about 39 years ago. He has a 2.50 pack-year smoking history. He quit smokeless tobacco use about 45 years ago.  His smokeless tobacco use included chew. He reports that he does not drink alcohol and does not use drugs.   Family History: The patient's family history includes Diabetes in his father and sister.   ROS:  Please see the history of present illness. Otherwise, complete review of systems is positive for none.  All other systems are reviewed and negative.   Physical Exam: VS:  BP 130/80   Pulse (!) 52   Ht 5\' 7"  (1.702 m)   Wt 223 lb 6.4 oz (101.3 kg)   SpO2 98%   BMI 34.99 kg/m , BMI Body mass index is 34.99 kg/m.  Wt Readings from Last 3 Encounters:  02/20/21 223 lb 6.4 oz (101.3 kg)  01/16/20 226 lb 12.8 oz (102.9 kg)  09/30/18 208 lb (94.3 kg)    General: Patient appears comfortable at rest. Neck: Supple, no elevated JVP or carotid bruits, no thyromegaly. Lungs: Clear to auscultation, nonlabored breathing at rest. Cardiac: Regular rate and rhythm, no S3 or significant systolic murmur, no pericardial rub. Extremities: No pitting edema, distal pulses 2+. Skin: Warm and dry. Musculoskeletal: No kyphosis. Neuropsychiatric: Alert and oriented x3, affect grossly appropriate.  ECG: 02/20/2021 EKG sinus bradycardia with incomplete RBBB, LAFB, rate of 53  Recent Labwork: No results found for requested labs within last 8760 hours.  No results found for: CHOL, TRIG, HDL, CHOLHDL, VLDL, LDLCALC, LDLDIRECT  Other Studies Reviewed Today:    Diagnostic Studies 04/10/14 Echo Study Conclusions  - Left ventricle: The cavity size was normal. Wall thickness was   normal. Systolic function was normal. The estimated ejection   fraction was in the range of 50% to 55%. The study is not   technically sufficient to allow evaluation of LV diastolic    function. - Aortic valve: Uncertain number of leaflets, cannot rule out AV   bicuspid valve. There was severe regurgitation. The AR vena   contracta is 0.8 cm. Valve area (VTI): 3.31 cm^2. Valve area   (Vmax): 3.11 cm^2. Valve area (Vmean): 2.83 cm^2. - Aorta: Aortic root dimension: 75 mm (ED). - Aortic root: The aortic root was severely dilated. The dilatation   is primarily of the right Sinus of valsalva. The sinotubular   junction, proximal ascending aorta, and aortic arch are poorly   visualized, cannot evaluate if aneurysmal. - Mitral valve: There was mild regurgitation. - Left atrium: The atrium was severely dilated. - Right ventricle: The cavity size was mildly dilated. - Right atrium: The atrium was moderately dilated. - Atrial septum: No defect or patent foramen ovale was identified. - Pulmonary arteries: Systolic pressure was moderately increased.   PA peak pressure: 50 mm Hg (S). - Technically difficult study.   04/21/14 Echo Study Conclusions  - Left ventricle: The cavity size was normal. Wall thickness  was   normal. Systolic function was moderately reduced. The estimated   ejection fraction was in the range of 35% to 40%. Diffuse   hypokinesis. - Aortic valve: A mechanical prosthesis was present and functioning   normally. Peak velocity (S): 230 cm/s. - Aorta: Aortic root repair intact. - Right ventricle: Systolic function was moderately reduced. - Right atrium: The atrium was mildly dilated. - Pericardium, extracardiac: There was no pericardial effusion.  Impressions:  - When compared to prior evaluation, EF has decreased.     Jan 2016 Cath   HEMODYNAMICS:  Aortic pressure 109/59 mmHg; LV pressure 105/10 mmHg; LVEDP 23 mmHg; RA 8 mmHg; RV 46/11 mmHg; PA 50/23 mmHg; PCWP(mean) 23 mmHg; Cardiac Output 6.24 L/m; AV gradient none   ANGIOGRAPHIC DATA:   The left main coronary artery is normal.   The left anterior descending artery is transapical and widely patent.  There are mid and distal luminal irregularities and there is some systolic compression in the mid LAD suggesting possible intramyocardial course. 2 large diagonals arise from the proximal to mid LAD.   The left circumflex artery is large and widely patent. The circumflex is dominant..   The right coronary artery is nondominant. No obstruction is noted..   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 There is accompanying 3+ aortic regurgitation. There also appears to be mild enlargement of the ascending aorta.   LEFT VENTRICULOGRAM: Determined by regurgitation from aorta during aortography) in the 20 RAO projection and revealed of normal to mildly dilated LV cavity size with ejection fraction of 45-50%.     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 Significant aortic regurgitation (3+) by aortography 3. Widely patent coronary arteries 4. Low normal to mildly depressed left ventricular systolic function in the 45 to 50% range.     08/2014 Echo Study Conclusions  - Left ventricle: The cavity size was normal. Wall thickness was   normal. Systolic function was normal. The estimated ejection   fraction was in the range of 60% to 65%. Wall motion was normal;   there were no regional wall motion abnormalities. Left   ventricular diastolic function parameters were normal. - Aortic valve: There is a 27 mm ST Jude mechanical valve conduit   in the AV/aortic root position. Normal functional mechanical AV.   There was no stenosis. There was no significant regurgitation.   Valve area (VTI): 2.03 cm^2. Valve area (Vmax): 2.06 cm^2. Valve   area (Vmean): 1.95 cm^2. - Aortic root: The aortic root was normal in size. There is a known   aortic conduit at the level of the aortic root. - Left atrium: The atrium was mildly dilated. - Atrial septum: No defect or patent foramen ovale was identified. - Technically adequate study.     Assessment and Plan:  1. History of aortic aneurysm   2. S/P AVR   3. Essential hypertension    1. History of aortic aneurysm History of aortic aneurysm status post repair.  Denies any issues since repair.   2. S/P AVR Status post aortic valve replacement.   Status post 27 mm Saint Jude mechanical valve conduit, Bentall procedure April 13, 2014.   Denies any issues status post replacement.  Continue Coumadin as directed by Coumadin clinic.  Continue aspirin 81 mg daily.  Denies any bleeding.  3. Essential hypertension Blood pressure today 130/80.  Continue lisinopril 2.5 mg daily, continue Toprol-XL 100 mg a.m. and 50 mg p.m.  Medication Adjustments/Labs and Tests Ordered: Current medicines are reviewed at length with the patient today.  Concerns regarding medicines are outlined above.   Disposition: Follow-up with Dr. Wyline Mood or APP 1 year  Signed, Rennis Harding, NP 02/20/2021 8:41 AM    [facility] at [facility] [address] [place], [place], [place] [address] Phone: [phone]; Fax: [phone]

## 2021-02-20 ENCOUNTER — Ambulatory Visit (INDEPENDENT_AMBULATORY_CARE_PROVIDER_SITE_OTHER): Payer: BC Managed Care – PPO | Admitting: Family Medicine

## 2021-02-20 ENCOUNTER — Other Ambulatory Visit: Payer: Self-pay

## 2021-02-20 ENCOUNTER — Encounter: Payer: Self-pay | Admitting: Family Medicine

## 2021-02-20 ENCOUNTER — Ambulatory Visit: Payer: BC Managed Care – PPO | Admitting: *Deleted

## 2021-02-20 VITALS — BP 130/80 | HR 52 | Ht 67.0 in | Wt 223.4 lb

## 2021-02-20 DIAGNOSIS — Z952 Presence of prosthetic heart valve: Secondary | ICD-10-CM | POA: Diagnosis not present

## 2021-02-20 DIAGNOSIS — Z8679 Personal history of other diseases of the circulatory system: Secondary | ICD-10-CM

## 2021-02-20 DIAGNOSIS — I10 Essential (primary) hypertension: Secondary | ICD-10-CM | POA: Diagnosis not present

## 2021-02-20 DIAGNOSIS — Z5181 Encounter for therapeutic drug level monitoring: Secondary | ICD-10-CM

## 2021-02-20 LAB — POCT INR: INR: 2.3 (ref 2.0–3.0)

## 2021-02-20 NOTE — Patient Instructions (Addendum)

## 2021-02-20 NOTE — Patient Instructions (Signed)
Continue warfarin 1 1/2 tablets daily except 1 tablet on Sundays and Wednesdays Continue greens Recheck in 6 weeks  

## 2021-04-03 ENCOUNTER — Ambulatory Visit (INDEPENDENT_AMBULATORY_CARE_PROVIDER_SITE_OTHER): Payer: BC Managed Care – PPO | Admitting: *Deleted

## 2021-04-03 ENCOUNTER — Other Ambulatory Visit: Payer: Self-pay

## 2021-04-03 DIAGNOSIS — Z5181 Encounter for therapeutic drug level monitoring: Secondary | ICD-10-CM

## 2021-04-03 DIAGNOSIS — Z952 Presence of prosthetic heart valve: Secondary | ICD-10-CM

## 2021-04-03 LAB — POCT INR: INR: 3.5 — AB (ref 2.0–3.0)

## 2021-04-03 NOTE — Patient Instructions (Signed)
Hold warfarin tonight then resume 1 1/2 tablets daily except 1 tablet on Sundays and Wednesdays Continue greens Recheck in 6 weeks

## 2021-04-28 ENCOUNTER — Telehealth: Payer: Self-pay

## 2021-04-28 MED ORDER — LISINOPRIL 2.5 MG PO TABS: 2.5000 mg | ORAL_TABLET | Freq: Every day | ORAL | 3 refills | Status: DC

## 2021-04-28 NOTE — Telephone Encounter (Signed)
Refill request for Lisinopril 2.5 mg tablets approved

## 2021-05-15 ENCOUNTER — Ambulatory Visit (INDEPENDENT_AMBULATORY_CARE_PROVIDER_SITE_OTHER): Payer: BC Managed Care – PPO | Admitting: *Deleted

## 2021-05-15 DIAGNOSIS — Z952 Presence of prosthetic heart valve: Secondary | ICD-10-CM | POA: Diagnosis not present

## 2021-05-15 DIAGNOSIS — Z5181 Encounter for therapeutic drug level monitoring: Secondary | ICD-10-CM

## 2021-05-15 LAB — POCT INR: INR: 2.2 (ref 2.0–3.0)

## 2021-05-15 NOTE — Patient Instructions (Signed)
Description   Continue take warfarin  1 1/2 tablets daily except 1 tablet on Sundays and Wednesdays Continue greens Recheck in 6 weeks

## 2021-06-26 ENCOUNTER — Ambulatory Visit (INDEPENDENT_AMBULATORY_CARE_PROVIDER_SITE_OTHER): Payer: BC Managed Care – PPO | Admitting: *Deleted

## 2021-06-26 DIAGNOSIS — Z5181 Encounter for therapeutic drug level monitoring: Secondary | ICD-10-CM

## 2021-06-26 DIAGNOSIS — Z952 Presence of prosthetic heart valve: Secondary | ICD-10-CM | POA: Diagnosis not present

## 2021-06-26 LAB — POCT INR: INR: 2.7 (ref 2.0–3.0)

## 2021-06-26 NOTE — Patient Instructions (Signed)
Description   ?Continue take warfarin  1 1/2 tablets daily except 1 tablet on Sundays and Wednesdays ?Continue greens ?Recheck in 6 weeks  ? ?  ? ? ?

## 2021-07-26 ENCOUNTER — Other Ambulatory Visit: Payer: Self-pay | Admitting: Cardiology

## 2021-08-04 ENCOUNTER — Ambulatory Visit (INDEPENDENT_AMBULATORY_CARE_PROVIDER_SITE_OTHER): Payer: BC Managed Care – PPO | Admitting: *Deleted

## 2021-08-04 DIAGNOSIS — Z952 Presence of prosthetic heart valve: Secondary | ICD-10-CM | POA: Diagnosis not present

## 2021-08-04 DIAGNOSIS — Z5181 Encounter for therapeutic drug level monitoring: Secondary | ICD-10-CM | POA: Diagnosis not present

## 2021-08-04 LAB — POCT INR: INR: 2.2 (ref 2.0–3.0)

## 2021-08-04 NOTE — Patient Instructions (Signed)
Continue take warfarin  1 1/2 tablets daily except 1 tablet on Sundays and Wednesdays Continue greens Recheck in 6 weeks  

## 2021-09-15 ENCOUNTER — Ambulatory Visit (INDEPENDENT_AMBULATORY_CARE_PROVIDER_SITE_OTHER): Payer: BC Managed Care – PPO | Admitting: *Deleted

## 2021-09-15 DIAGNOSIS — Z952 Presence of prosthetic heart valve: Secondary | ICD-10-CM | POA: Diagnosis not present

## 2021-09-15 DIAGNOSIS — Z5181 Encounter for therapeutic drug level monitoring: Secondary | ICD-10-CM

## 2021-09-15 LAB — POCT INR: INR: 2.2 (ref 2.0–3.0)

## 2021-09-15 NOTE — Patient Instructions (Signed)
Continue take warfarin  1 1/2 tablets daily except 1 tablet on Sundays and Wednesdays Continue greens Recheck in 6 weeks  

## 2021-10-10 ENCOUNTER — Telehealth: Payer: Self-pay | Admitting: Cardiology

## 2021-10-10 MED ORDER — WARFARIN SODIUM 5 MG PO TABS: ORAL_TABLET | ORAL | 0 refills | Status: DC

## 2021-10-10 NOTE — Telephone Encounter (Signed)
  *  STAT* If patient is at the pharmacy, call can be transferred to refill team.   1. Which medications need to be refilled? (please list name of each medication and dose if known) warfarin (COUMADIN) 5 MG tablet  2. Which pharmacy/location (including street and city if local pharmacy) is medication to be sent to?[facility] - [place], [place] - [address]  3. Do they need a 30 day or 90 day supply? 90 days  Pt is out of meds. Needs refill today

## 2021-10-10 NOTE — Telephone Encounter (Signed)
Prescription refill request received for warfarin Lov: Vincenza Hews Next INR check: 7/24 Warfarin tablet strength: 5mg 

## 2021-10-27 ENCOUNTER — Ambulatory Visit (INDEPENDENT_AMBULATORY_CARE_PROVIDER_SITE_OTHER): Payer: BC Managed Care – PPO | Admitting: *Deleted

## 2021-10-27 DIAGNOSIS — Z5181 Encounter for therapeutic drug level monitoring: Secondary | ICD-10-CM | POA: Diagnosis not present

## 2021-10-27 DIAGNOSIS — Z952 Presence of prosthetic heart valve: Secondary | ICD-10-CM | POA: Diagnosis not present

## 2021-10-27 LAB — POCT INR: INR: 2.2 (ref 2.0–3.0)

## 2021-10-27 NOTE — Patient Instructions (Signed)
Continue take warfarin  1 1/2 tablets daily except 1 tablet on Sundays and Wednesdays Continue greens Recheck in 6 weeks

## 2021-12-09 ENCOUNTER — Ambulatory Visit: Payer: BC Managed Care – PPO | Attending: Cardiology | Admitting: *Deleted

## 2021-12-09 DIAGNOSIS — Z952 Presence of prosthetic heart valve: Secondary | ICD-10-CM | POA: Diagnosis not present

## 2021-12-09 DIAGNOSIS — Z5181 Encounter for therapeutic drug level monitoring: Secondary | ICD-10-CM | POA: Diagnosis not present

## 2021-12-09 LAB — POCT INR: INR: 2.9 (ref 2.0–3.0)

## 2021-12-09 NOTE — Patient Instructions (Signed)
Continue take warfarin  1 1/2 tablets daily except 1 tablet on Sundays and Wednesdays Continue greens Recheck in 6 weeks

## 2022-01-13 ENCOUNTER — Telehealth: Payer: Self-pay | Admitting: Cardiology

## 2022-01-13 MED ORDER — WARFARIN SODIUM 5 MG PO TABS: ORAL_TABLET | ORAL | 1 refills | Status: DC

## 2022-01-13 NOTE — Telephone Encounter (Signed)
*  STAT* If patient is at the pharmacy, call can be transferred to refill team.   1. Which medications need to be refilled? (please list name of each medication and dose if known) Warfarin  2. Which pharmacy/location (including street and city if local pharmacy) is medication to be sent to? [facility] [address] [place]   3. Do they need a 30 day or 90 day supply? Need this called in today please- need for tomorrow

## 2022-01-13 NOTE — Telephone Encounter (Signed)
Refill request for warfarin:  Last INR was 2.9 on 12/09/21 Next INR due on 01/20/22 LOV was 02/20/21  Refill approved.

## 2022-01-20 ENCOUNTER — Ambulatory Visit: Payer: BC Managed Care – PPO | Attending: Cardiology | Admitting: *Deleted

## 2022-01-20 DIAGNOSIS — Z5181 Encounter for therapeutic drug level monitoring: Secondary | ICD-10-CM

## 2022-01-20 DIAGNOSIS — Z952 Presence of prosthetic heart valve: Secondary | ICD-10-CM | POA: Diagnosis not present

## 2022-01-20 LAB — POCT INR: INR: 1.9 — AB (ref 2.0–3.0)

## 2022-01-20 NOTE — Patient Instructions (Signed)
Take warfarin 2 tablets tonight then resume 1 1/2 tablets daily except 1 tablet on Sundays and Wednesdays Continue greens Recheck in 6 weeks

## 2022-02-09 ENCOUNTER — Other Ambulatory Visit: Payer: Self-pay | Admitting: Cardiology

## 2022-02-20 ENCOUNTER — Ambulatory Visit: Payer: BC Managed Care – PPO | Admitting: Student

## 2022-03-03 ENCOUNTER — Ambulatory Visit: Payer: BC Managed Care – PPO | Attending: Cardiology | Admitting: *Deleted

## 2022-03-03 DIAGNOSIS — Z5181 Encounter for therapeutic drug level monitoring: Secondary | ICD-10-CM | POA: Diagnosis not present

## 2022-03-03 DIAGNOSIS — Z952 Presence of prosthetic heart valve: Secondary | ICD-10-CM

## 2022-03-03 LAB — POCT INR: INR: 2.2 (ref 2.0–3.0)

## 2022-03-03 NOTE — Patient Instructions (Signed)
Continue warfarin 1 1/2 tablets daily except 1 tablet on Sundays and Wednesdays Continue greens Recheck in 6 weeks

## 2022-03-05 ENCOUNTER — Encounter: Payer: Self-pay | Admitting: Cardiology

## 2022-03-05 ENCOUNTER — Encounter: Payer: Self-pay | Admitting: *Deleted

## 2022-03-05 ENCOUNTER — Ambulatory Visit: Payer: BC Managed Care – PPO | Attending: Student | Admitting: Cardiology

## 2022-03-05 VITALS — BP 136/84 | HR 64 | Ht 67.0 in | Wt 222.0 lb

## 2022-03-05 DIAGNOSIS — Z8679 Personal history of other diseases of the circulatory system: Secondary | ICD-10-CM

## 2022-03-05 DIAGNOSIS — I10 Essential (primary) hypertension: Secondary | ICD-10-CM | POA: Diagnosis not present

## 2022-03-05 DIAGNOSIS — Z952 Presence of prosthetic heart valve: Secondary | ICD-10-CM | POA: Diagnosis not present

## 2022-03-05 NOTE — Progress Notes (Signed)
Clinical Summary [name] is a 61 y.o.male seen today for follow up of the following medical problems.      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 HTN - compliant with meds - home bp's 130s/70s   Past Medical History:  Diagnosis Date   Aortic root aneurysm    Chronic systolic heart failure (HCC)      Allergies  Allergen Reactions   Latex Rash     Current Outpatient Medications  Medication Sig Dispense Refill   acetaminophen (TYLENOL) 500 MG tablet Take 500 mg by mouth every 6 (six) hours as needed.     aspirin EC 81 MG tablet Take 1 tablet (81 mg total) by mouth daily.     lisinopril (ZESTRIL) 2.5 MG tablet Take 1 tablet (2.5 mg total) by mouth daily. 90 tablet 3   metoprolol succinate (TOPROL-XL) 100 MG 24 hr tablet TAKE 1 TABLET BY MOUTH EVERY MORNING AND 1/2 TABLET IN THE EVENING 135 tablet 0   warfarin (COUMADIN) 5 MG tablet Take 1 to 1&1/2 tablets by mouth daily as directed by the coumadin clinic. 130 tablet 1   No current facility-administered medications for this visit.     Past Surgical History:  Procedure Laterality Date   BENTALL PROCEDURE N/A 04/13/2014   Procedure: BENTALL PROCEDURE;  Surgeon: Alleen Borne, MD;  Location: [facility] OR;  Service: Open Heart Surgery;  Laterality: N/A;   LEFT AND RIGHT HEART CATHETERIZATION WITH CORONARY ANGIOGRAM N/A 04/12/2014   Procedure: LEFT AND RIGHT HEART CATHETERIZATION WITH CORONARY ANGIOGRAM;  Surgeon: Lesleigh Noe, MD;  Location: [facility] CATH LAB;  Service: Cardiovascular;  Laterality: N/A;   TEE WITHOUT CARDIOVERSION N/A 04/13/2014   Procedure: TRANSESOPHAGEAL ECHOCARDIOGRAM (TEE);  Surgeon: Alleen Borne,  MD;  Location: [facility] OR;  Service: Open Heart Surgery;  Laterality: N/A;     Allergies  Allergen Reactions   Latex Rash      Family History  Problem Relation Age of Onset   Diabetes Father    Diabetes Sister      Social History Vincent [name] reports that he quit smoking about 28 years ago. His smoking use included cigarettes. He started smoking about 40 years ago. He has a 2.50 pack-year smoking history. He quit smokeless tobacco use about 46 years ago.  His smokeless tobacco use included chew. Vincent [name] reports no history of alcohol use.   Review of Systems CONSTITUTIONAL: No weight loss, fever, chills, weakness or fatigue.  HEENT: Eyes: No visual loss, blurred vision, double vision or yellow sclerae.No hearing loss, sneezing, congestion, runny nose or sore throat.  SKIN: No rash or itching.  CARDIOVASCULAR: no chest pain, no palpitations RESPIRATORY: No shortness of breath, cough or sputum.  GASTROINTESTINAL: No anorexia, nausea, vomiting or diarrhea. No abdominal pain or blood.  GENITOURINARY: No burning on urination, no polyuria NEUROLOGICAL: No headache, dizziness, syncope, paralysis, ataxia, numbness or tingling in the extremities. No change in bowel or bladder control.  MUSCULOSKELETAL: No muscle, back pain, joint pain or stiffness.  LYMPHATICS: No enlarged nodes. No history of splenectomy.  PSYCHIATRIC: No history of depression or  anxiety.  ENDOCRINOLOGIC: No reports of sweating, cold or heat intolerance. No polyuria or polydipsia.  Marland Kitchen   Physical Examination Today's Vitals   03/05/22 1009  BP: 136/84  Pulse: 64  SpO2: 98%  Weight: 222 lb (100.7 kg)  Height: 5\' 7"  (1.702 m)   Body mass index is 34.77 kg/m.  Gen: resting comfortably, no acute distress HEENT: no scleral icterus, pupils equal round and reactive, no palptable cervical adenopathy,  CV: RRR, mechanical S2, no m/r/g Resp: Clear to auscultation bilaterally GI: abdomen is soft, non-tender, non-distended,  normal bowel sounds, no hepatosplenomegaly MSK: extremities are warm, no edema.  Skin: warm, no rash Neuro:  no focal deficits Psych: appropriate affect   Diagnostic Studies 04/10/14 Echo Study Conclusions  - Left ventricle: The cavity size was normal. Wall thickness was   normal. Systolic function was normal. The estimated ejection   fraction was in the range of 50% to 55%. The study is not   technically sufficient to allow evaluation of LV diastolic   function. - Aortic valve: Uncertain number of leaflets, cannot rule out AV   bicuspid valve. There was severe regurgitation. The AR vena   contracta is 0.8 cm. Valve area (VTI): 3.31 cm^2. Valve area   (Vmax): 3.11 cm^2. Valve area (Vmean): 2.83 cm^2. - Aorta: Aortic root dimension: 75 mm (ED). - Aortic root: The aortic root was severely dilated. The dilatation   is primarily of the right Sinus of valsalva. The sinotubular   junction, proximal ascending aorta, and aortic arch are poorly   visualized, cannot evaluate if aneurysmal. - Mitral valve: There was mild regurgitation. - Left atrium: The atrium was severely dilated. - Right ventricle: The cavity size was mildly dilated. - Right atrium: The atrium was moderately dilated. - Atrial septum: No defect or patent foramen ovale was identified. - Pulmonary arteries: Systolic pressure was moderately increased.   PA peak pressure: 50 mm Hg (S). - Technically difficult study.   04/21/14 Echo Study Conclusions  - Left ventricle: The cavity size was normal. Wall thickness was   normal. Systolic function was moderately reduced. The estimated   ejection fraction was in the range of 35% to 40%. Diffuse   hypokinesis. - Aortic valve: A mechanical prosthesis was present and functioning   normally. Peak velocity (S): 230 cm/s. - Aorta: Aortic root repair intact. - Right ventricle: Systolic function was moderately reduced. - Right atrium: The atrium was mildly dilated. - Pericardium,  extracardiac: There was no pericardial effusion.  Impressions:  - When compared to prior evaluation, EF has decreased.     Jan 2016 Cath   HEMODYNAMICS:  Aortic pressure 109/59 mmHg; LV pressure 105/10 mmHg; LVEDP 23 mmHg; RA 8 mmHg; RV 46/11 mmHg; PA 50/23 mmHg; PCWP(mean) 23 mmHg; Cardiac Output 6.24 L/m; AV gradient none   ANGIOGRAPHIC DATA:   The left main coronary artery is normal.   The left anterior descending artery is transapical and widely patent. There are mid and distal luminal irregularities and there is some systolic compression in the mid LAD suggesting possible intramyocardial course. 2 large diagonals arise from the proximal to mid LAD.   The left circumflex artery is large and widely patent. The circumflex is dominant..   The right coronary artery is nondominant. No obstruction is noted..   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 There is accompanying 3+ aortic regurgitation. There also appears to be mild enlargement of the ascending aorta.   LEFT VENTRICULOGRAM:  Determined by regurgitation from aorta during aortography) in the 20 RAO projection and revealed of normal to mildly dilated LV cavity size with ejection fraction of 45-50%.     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 Significant aortic regurgitation (3+) by aortography 3. Widely patent coronary arteries 4. Low normal to mildly depressed left ventricular systolic function in the 45 to 50% range.     08/2014 Echo Study Conclusions  - Left ventricle: The cavity size was normal. Wall thickness was   normal. Systolic function was normal. The estimated ejection   fraction was in the range of 60% to 65%. Wall motion was normal;   there were no regional wall motion abnormalities. Left   ventricular diastolic function parameters were normal. - Aortic valve: There is a 27 mm ST Jude mechanical valve conduit   in the AV/aortic root  position. Normal functional mechanical AV.   There was no stenosis. There was no significant regurgitation.   Valve area (VTI): 2.03 cm^2. Valve area (Vmax): 2.06 cm^2. Valve   area (Vmean): 1.95 cm^2. - Aortic root: The aortic root was normal in size. There is a known   aortic conduit at the level of the aortic root. - Left atrium: The atrium was mildly dilated. - Atrial septum: No defect or patent foramen ovale was identified. - Technically adequate study.    Assessment and Plan   1. Aortic aneurysm with severe AI  - s/p 27 mm ST Jude mechanical valve conduit (Bentall procedure) Apr 13, 2014 - continues to do well nearly 8 years from surgery - continue coumadin/ASA in setting of mechanical valve   2. HTN - at goal, continue current meds  EKG today shows SR, RBBB/LAFB   F/u 1 year, request labs from pcp  Antoine Poche, M.D.

## 2022-03-05 NOTE — Patient Instructions (Signed)

## 2022-04-01 ENCOUNTER — Ambulatory Visit: Payer: BC Managed Care – PPO | Admitting: Nurse Practitioner

## 2022-04-14 ENCOUNTER — Ambulatory Visit: Payer: BC Managed Care – PPO | Attending: Cardiology | Admitting: *Deleted

## 2022-04-14 DIAGNOSIS — Z952 Presence of prosthetic heart valve: Secondary | ICD-10-CM | POA: Diagnosis not present

## 2022-04-14 DIAGNOSIS — Z5181 Encounter for therapeutic drug level monitoring: Secondary | ICD-10-CM | POA: Diagnosis not present

## 2022-04-14 LAB — POCT INR: INR: 3.6 — AB (ref 2.0–3.0)

## 2022-04-14 NOTE — Patient Instructions (Signed)
Hold warfarin tonight then resume 1 1/2 tablets daily except 1 tablet on Sundays and Wednesdays Continue greens Recheck in 3 weeks

## 2022-04-21 ENCOUNTER — Telehealth: Payer: Self-pay | Admitting: *Deleted

## 2022-04-21 NOTE — Telephone Encounter (Signed)
Pt came into office wanting to let you know that he's been started on an antibiotic and prednisone.  Please call [phone]

## 2022-04-21 NOTE — Telephone Encounter (Signed)
Called and spoke with patient.  He was started on Augmentin 500/125mg  tid x 7 days and Prednisone 20mg  x 5 days for sinus infection on 04/18/22., Told pt to decrease warfarin to 1 tablet daily till Saturday 1/20 then resume regular dose I 1/2 tablets daily except 1 tablet on Sundays and Wednesdays)  Recheck INR on 05/05/22.  Pt verbalized understanding.

## 2022-05-05 ENCOUNTER — Ambulatory Visit: Payer: BC Managed Care – PPO | Attending: Cardiology | Admitting: *Deleted

## 2022-05-05 DIAGNOSIS — Z952 Presence of prosthetic heart valve: Secondary | ICD-10-CM | POA: Diagnosis not present

## 2022-05-05 DIAGNOSIS — Z5181 Encounter for therapeutic drug level monitoring: Secondary | ICD-10-CM

## 2022-05-05 LAB — POCT INR: INR: 2.1 (ref 2.0–3.0)

## 2022-05-05 NOTE — Patient Instructions (Signed)
Continue warfarin 1 1/2 tablets daily except 1 tablet on Sundays and Wednesdays Continue greens Recheck in 4 weeks

## 2022-05-18 ENCOUNTER — Other Ambulatory Visit: Payer: Self-pay | Admitting: Cardiology

## 2022-05-25 ENCOUNTER — Telehealth: Payer: Self-pay | Admitting: Cardiology

## 2022-05-25 MED ORDER — LISINOPRIL 2.5 MG PO TABS: 2.5000 mg | ORAL_TABLET | Freq: Every day | ORAL | 1 refills | Status: DC

## 2022-05-25 NOTE — Telephone Encounter (Signed)
Done

## 2022-05-25 NOTE — Telephone Encounter (Signed)
*  STAT* If patient is at the pharmacy, call can be transferred to refill team.   1. Which medications need to be refilled? (please list name of each medication and dose if known)    Lisinopril  2. Which pharmacy/location (including street and city if local pharmacy) is medication to be sent to?  [facility]  3. Do they need a 30 day or 90 day supply?   90 day

## 2022-06-02 ENCOUNTER — Ambulatory Visit: Payer: BC Managed Care – PPO | Attending: Cardiology | Admitting: Pharmacist

## 2022-06-02 DIAGNOSIS — Z5181 Encounter for therapeutic drug level monitoring: Secondary | ICD-10-CM

## 2022-06-02 DIAGNOSIS — Z952 Presence of prosthetic heart valve: Secondary | ICD-10-CM | POA: Diagnosis not present

## 2022-06-02 LAB — POCT INR: INR: 2.6 (ref 2.0–3.0)

## 2022-06-02 NOTE — Patient Instructions (Signed)
Description   Continue warfarin 1 1/2 tablets daily except 1 tablet on Sundays and Wednesdays Continue greens Recheck in 5 weeks

## 2022-06-04 ENCOUNTER — Encounter: Payer: Self-pay | Admitting: *Deleted

## 2022-07-07 ENCOUNTER — Ambulatory Visit: Payer: BC Managed Care – PPO | Attending: Cardiology | Admitting: *Deleted

## 2022-07-07 DIAGNOSIS — Z5181 Encounter for therapeutic drug level monitoring: Secondary | ICD-10-CM

## 2022-07-07 DIAGNOSIS — Z952 Presence of prosthetic heart valve: Secondary | ICD-10-CM | POA: Diagnosis not present

## 2022-07-07 LAB — POCT INR: INR: 2 (ref 2.0–3.0)

## 2022-07-07 NOTE — Patient Instructions (Signed)
Continue warfarin 1 1/2 tablets daily except 1 tablet on Sundays and Wednesdays Continue greens Recheck in 6 weeks  

## 2022-07-28 ENCOUNTER — Telehealth: Payer: Self-pay | Admitting: *Deleted

## 2022-07-28 MED ORDER — WARFARIN SODIUM 5 MG PO TABS: ORAL_TABLET | ORAL | 1 refills | Status: DC

## 2022-07-28 NOTE — Telephone Encounter (Signed)
*  STAT* If patient is at the pharmacy, call can be transferred to refill team.   1. Which medications need to be refilled? (please list name of each medication and dose if known)    warfarin (COUMADIN) 5 MG tablet   2. Which pharmacy/location (including street and city if local pharmacy) is medication to be sent to?  [facility]   3. Do they need a 30 day or 90 day supply?   90 day

## 2022-07-28 NOTE — Telephone Encounter (Signed)
Refill request for warfarin:  Last INR was 2.0 on 07/07/22 Next INR due 08/18/22 LOV was 03/05/22  Refill approved.

## 2022-08-18 ENCOUNTER — Ambulatory Visit: Payer: BC Managed Care – PPO | Attending: Cardiology | Admitting: *Deleted

## 2022-08-18 DIAGNOSIS — Z952 Presence of prosthetic heart valve: Secondary | ICD-10-CM | POA: Diagnosis not present

## 2022-08-18 DIAGNOSIS — Z5181 Encounter for therapeutic drug level monitoring: Secondary | ICD-10-CM | POA: Diagnosis not present

## 2022-08-18 LAB — POCT INR: INR: 2.3 (ref 2.0–3.0)

## 2022-08-18 NOTE — Patient Instructions (Signed)
Continue warfarin 1 1/2 tablets daily except 1 tablet on Sundays and Wednesdays Continue greens Recheck in 6 weeks  

## 2022-09-29 ENCOUNTER — Ambulatory Visit: Payer: BC Managed Care – PPO | Attending: Cardiology | Admitting: *Deleted

## 2022-09-29 DIAGNOSIS — Z952 Presence of prosthetic heart valve: Secondary | ICD-10-CM | POA: Diagnosis not present

## 2022-09-29 DIAGNOSIS — Z5181 Encounter for therapeutic drug level monitoring: Secondary | ICD-10-CM

## 2022-09-29 LAB — POCT INR: INR: 3.1 — AB (ref 2.0–3.0)

## 2022-09-29 NOTE — Patient Instructions (Signed)
Take 1/2 tablet tonight then resume 1 1/2 tablets daily except 1 tablet on Sundays and Wednesdays Increase greens while finishing Augmentin Recheck in 6 weeks

## 2022-11-10 ENCOUNTER — Ambulatory Visit: Payer: BC Managed Care – PPO | Attending: Cardiology | Admitting: *Deleted

## 2022-11-10 DIAGNOSIS — Z952 Presence of prosthetic heart valve: Secondary | ICD-10-CM

## 2022-11-10 DIAGNOSIS — Z5181 Encounter for therapeutic drug level monitoring: Secondary | ICD-10-CM | POA: Diagnosis not present

## 2022-11-10 LAB — POCT INR: INR: 3 (ref 2.0–3.0)

## 2022-11-10 NOTE — Patient Instructions (Addendum)
Continue warfarin 1 1/2 tablets daily except 1 tablet on Sundays and Wednesdays Continue greens/salads Recheck in 7 weeks

## 2022-12-29 ENCOUNTER — Ambulatory Visit: Payer: BC Managed Care – PPO | Attending: Cardiology | Admitting: *Deleted

## 2022-12-29 DIAGNOSIS — Z5181 Encounter for therapeutic drug level monitoring: Secondary | ICD-10-CM

## 2022-12-29 DIAGNOSIS — Z952 Presence of prosthetic heart valve: Secondary | ICD-10-CM

## 2022-12-29 LAB — POCT INR: INR: 2.6 (ref 2.0–3.0)

## 2022-12-29 NOTE — Patient Instructions (Signed)
Continue warfarin 1 1/2 tablets daily except 1 tablet on Sundays and Wednesdays Continue greens/salads Recheck in 7 weeks

## 2022-12-31 ENCOUNTER — Other Ambulatory Visit: Payer: Self-pay | Admitting: Cardiology

## 2023-02-09 ENCOUNTER — Other Ambulatory Visit: Payer: Self-pay | Admitting: Cardiology

## 2023-02-09 NOTE — Telephone Encounter (Signed)
Refill request for warfarin:  Last INR was 2.6 on 12/29/22 Next INR is 02/16/23 LOV was 03/05/22  Refill approved.

## 2023-02-16 ENCOUNTER — Ambulatory Visit: Payer: BC Managed Care – PPO | Attending: Cardiology | Admitting: *Deleted

## 2023-02-16 DIAGNOSIS — Z952 Presence of prosthetic heart valve: Secondary | ICD-10-CM | POA: Diagnosis not present

## 2023-02-16 DIAGNOSIS — Z5181 Encounter for therapeutic drug level monitoring: Secondary | ICD-10-CM

## 2023-02-16 LAB — POCT INR: INR: 2.3 (ref 2.0–3.0)

## 2023-02-16 NOTE — Patient Instructions (Signed)
Continue warfarin 1 1/2 tablets daily except 1 tablet on Sundays and Wednesdays Continue greens/salads Recheck in 7 weeks

## 2023-02-18 ENCOUNTER — Telehealth: Payer: Self-pay | Admitting: *Deleted

## 2023-02-18 NOTE — Telephone Encounter (Signed)
Pt started Augmentin 2 x daily yesterday for sinus infection.  Will take for 10 days.  Told pt to decrease warfarin to 1 tablet daily except 1 1/2 tablets on Tuesdays and Fridays while on Abx then resume current dose.  He verbalized understanding.

## 2023-02-18 NOTE — Telephone Encounter (Signed)
Pt started on Antibiotic Augmentin 875 mg-125 mg oral tablet - take 1 tab by mouth

## 2023-03-25 ENCOUNTER — Ambulatory Visit: Payer: BC Managed Care – PPO | Attending: Cardiology | Admitting: *Deleted

## 2023-03-25 DIAGNOSIS — Z952 Presence of prosthetic heart valve: Secondary | ICD-10-CM | POA: Diagnosis not present

## 2023-03-25 DIAGNOSIS — Z5181 Encounter for therapeutic drug level monitoring: Secondary | ICD-10-CM | POA: Diagnosis not present

## 2023-03-25 LAB — POCT INR: INR: 3 (ref 2.0–3.0)

## 2023-03-25 NOTE — Patient Instructions (Signed)
Continue warfarin 1 1/2 tablets daily except 1 tablet on Sundays and Wednesdays Continue greens/salads Recheck in 7 weeks

## 2023-04-08 ENCOUNTER — Other Ambulatory Visit: Payer: Self-pay | Admitting: Cardiology

## 2023-05-04 ENCOUNTER — Other Ambulatory Visit: Payer: Self-pay | Admitting: Cardiology

## 2023-05-06 ENCOUNTER — Encounter: Payer: Self-pay | Admitting: *Deleted

## 2023-05-17 ENCOUNTER — Ambulatory Visit: Payer: BC Managed Care – PPO | Attending: Cardiology | Admitting: *Deleted

## 2023-05-17 DIAGNOSIS — Z5181 Encounter for therapeutic drug level monitoring: Secondary | ICD-10-CM

## 2023-05-17 DIAGNOSIS — Z952 Presence of prosthetic heart valve: Secondary | ICD-10-CM

## 2023-05-17 LAB — POCT INR: INR: 2.4 (ref 2.0–3.0)

## 2023-05-17 NOTE — Patient Instructions (Signed)
Continue warfarin 1 1/2 tablets daily except 1 tablet on Sundays and Wednesdays Continue greens/salads Recheck in 7 weeks

## 2023-06-24 ENCOUNTER — Other Ambulatory Visit: Payer: Self-pay | Admitting: Cardiology

## 2023-06-28 ENCOUNTER — Encounter: Payer: Self-pay | Admitting: *Deleted

## 2023-06-30 ENCOUNTER — Ambulatory Visit: Payer: BC Managed Care – PPO | Admitting: Cardiology

## 2023-06-30 ENCOUNTER — Ambulatory Visit: Payer: BC Managed Care – PPO | Attending: Cardiology | Admitting: *Deleted

## 2023-06-30 DIAGNOSIS — Z5181 Encounter for therapeutic drug level monitoring: Secondary | ICD-10-CM

## 2023-06-30 DIAGNOSIS — Z952 Presence of prosthetic heart valve: Secondary | ICD-10-CM | POA: Diagnosis not present

## 2023-06-30 LAB — POCT INR: INR: 2.4 (ref 2.0–3.0)

## 2023-06-30 NOTE — Progress Notes (Deleted)
 Clinical Summary [name] is a 63 y.o.male  seen today for follow up of the following medical problems.      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 HTN - compliant with meds - home bp's 130s/70s Past Medical History:  Diagnosis Date   Aortic root aneurysm (HCC)    Chronic systolic heart failure (HCC)      Allergies  Allergen Reactions   Latex Rash     Current Outpatient Medications  Medication Sig Dispense Refill   acetaminophen (TYLENOL) 500 MG tablet Take 500 mg by mouth every 6 (six) hours as needed.     aspirin EC 81 MG tablet Take 1 tablet (81 mg total) by mouth daily.     lisinopril (ZESTRIL) 2.5 MG tablet TAKE 1 TABLET BY MOUTH EVERY DAY. 90 tablet 2   metoprolol succinate (TOPROL-XL) 100 MG 24 hr tablet TAKE 1 TABLET BY MOUTH EVERY MORNING AND 1/2 TABLET IN THE EVENING 45 tablet 0   warfarin (COUMADIN) 5 MG tablet TAKE 1 TO 1 & 1/2 TABLET BY MOUTH DAILY AS DIRECTED BY THE COUMADIN CLINIC 130 tablet 1   No current facility-administered medications for this visit.     Past Surgical History:  Procedure Laterality Date   BENTALL PROCEDURE N/A 04/13/2014   Procedure: BENTALL PROCEDURE;  Surgeon: Alleen Borne, MD;  Location: [facility] OR;  Service: Open Heart Surgery;  Laterality: N/A;   LEFT AND RIGHT HEART CATHETERIZATION WITH CORONARY ANGIOGRAM N/A 04/12/2014   Procedure: LEFT AND RIGHT HEART CATHETERIZATION WITH CORONARY ANGIOGRAM;  Surgeon: Lesleigh Noe, MD;  Location: [facility] CATH LAB;  Service: Cardiovascular;  Laterality: N/A;   TEE WITHOUT CARDIOVERSION N/A 04/13/2014   Procedure: TRANSESOPHAGEAL ECHOCARDIOGRAM (TEE);  Surgeon: Alleen Borne, MD;   Location: [facility] OR;  Service: Open Heart Surgery;  Laterality: N/A;     Allergies  Allergen Reactions   Latex Rash      Family History  Problem Relation Age of Onset   Diabetes Father    Diabetes Sister      Social History [name] reports that he quit smoking about 29 years ago. His smoking use included cigarettes. He started smoking about 41 years ago. He has a 3 pack-year smoking history. He quit smokeless tobacco use about 47 years ago.  His smokeless tobacco use included chew. [name] reports no history of alcohol use.   Review of Systems CONSTITUTIONAL: No weight loss, fever, chills, weakness or fatigue.  HEENT: Eyes: No visual loss, blurred vision, double vision or yellow sclerae.No hearing loss, sneezing, congestion, runny nose or sore throat.  SKIN: No rash or itching.  CARDIOVASCULAR:  RESPIRATORY: No shortness of breath, cough or sputum.  GASTROINTESTINAL: No anorexia, nausea, vomiting or diarrhea. No abdominal pain or blood.  GENITOURINARY: No burning on urination, no polyuria NEUROLOGICAL: No headache, dizziness, syncope, paralysis, ataxia, numbness or tingling in the extremities. No change in bowel or bladder control.  MUSCULOSKELETAL: No muscle, back pain, joint pain or stiffness.  LYMPHATICS: No enlarged nodes. No history of splenectomy.  PSYCHIATRIC: No history of depression or anxiety.  ENDOCRINOLOGIC: No  reports of sweating, cold or heat intolerance. No polyuria or polydipsia.  Marland Kitchen   Physical Examination There were no vitals filed for this visit. There were no vitals filed for this visit.  Gen: resting comfortably, no acute distress HEENT: no scleral icterus, pupils equal round and reactive, no palptable cervical adenopathy,  CV Resp: Clear to auscultation bilaterally GI: abdomen is soft, non-tender, non-distended, normal bowel sounds, no hepatosplenomegaly MSK: extremities are warm, no edema.  Skin: warm, no rash Neuro:  no focal deficits Psych:  appropriate affect   Diagnostic Studies 04/10/14 Echo Study Conclusions  - Left ventricle: The cavity size was normal. Wall thickness was   normal. Systolic function was normal. The estimated ejection   fraction was in the range of 50% to 55%. The study is not   technically sufficient to allow evaluation of LV diastolic   function. - Aortic valve: Uncertain number of leaflets, cannot rule out AV   bicuspid valve. There was severe regurgitation. The AR vena   contracta is 0.8 cm. Valve area (VTI): 3.31 cm^2. Valve area   (Vmax): 3.11 cm^2. Valve area (Vmean): 2.83 cm^2. - Aorta: Aortic root dimension: 75 mm (ED). - Aortic root: The aortic root was severely dilated. The dilatation   is primarily of the right Sinus of valsalva. The sinotubular   junction, proximal ascending aorta, and aortic arch are poorly   visualized, cannot evaluate if aneurysmal. - Mitral valve: There was mild regurgitation. - Left atrium: The atrium was severely dilated. - Right ventricle: The cavity size was mildly dilated. - Right atrium: The atrium was moderately dilated. - Atrial septum: No defect or patent foramen ovale was identified. - Pulmonary arteries: Systolic pressure was moderately increased.   PA peak pressure: 50 mm Hg (S). - Technically difficult study.   04/21/14 Echo Study Conclusions  - Left ventricle: The cavity size was normal. Wall thickness was   normal. Systolic function was moderately reduced. The estimated   ejection fraction was in the range of 35% to 40%. Diffuse   hypokinesis. - Aortic valve: A mechanical prosthesis was present and functioning   normally. Peak velocity (S): 230 cm/s. - Aorta: Aortic root repair intact. - Right ventricle: Systolic function was moderately reduced. - Right atrium: The atrium was mildly dilated. - Pericardium, extracardiac: There was no pericardial effusion.  Impressions:  - When compared to prior evaluation, EF has decreased.     Jan 2016  Cath   HEMODYNAMICS:  Aortic pressure 109/59 mmHg; LV pressure 105/10 mmHg; LVEDP 23 mmHg; RA 8 mmHg; RV 46/11 mmHg; PA 50/23 mmHg; PCWP(mean) 23 mmHg; Cardiac Output 6.24 L/m; AV gradient none   ANGIOGRAPHIC DATA:   The left main coronary artery is normal.   The left anterior descending artery is transapical and widely patent. There are mid and distal luminal irregularities and there is some systolic compression in the mid LAD suggesting possible intramyocardial course. 2 large diagonals arise from the proximal to mid LAD.   The left circumflex artery is large and widely patent. The circumflex is dominant..   The right coronary artery is nondominant. No obstruction is noted..   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 There is accompanying 3+ aortic regurgitation. There also appears to be mild enlargement of the ascending aorta.   LEFT VENTRICULOGRAM: Determined by regurgitation from aorta during aortography) in the 20 RAO projection and revealed of normal to mildly dilated LV cavity size with ejection fraction of 45-50%.  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 Significant aortic regurgitation (3+) by aortography 3. Widely patent coronary arteries 4. Low normal to mildly depressed left ventricular systolic function in the 45 to 50% range.     08/2014 Echo Study Conclusions  - Left ventricle: The cavity size was normal. Wall thickness was   normal. Systolic function was normal. The estimated ejection   fraction was in the range of 60% to 65%. Wall motion was normal;   there were no regional wall motion abnormalities. Left   ventricular diastolic function parameters were normal. - Aortic valve: There is a 27 mm ST Jude mechanical valve conduit   in the AV/aortic root position. Normal functional mechanical AV.   There was no stenosis. There was no significant regurgitation.   Valve area (VTI): 2.03 cm^2.  Valve area (Vmax): 2.06 cm^2. Valve   area (Vmean): 1.95 cm^2. - Aortic root: The aortic root was normal in size. There is a known   aortic conduit at the level of the aortic root. - Left atrium: The atrium was mildly dilated. - Atrial septum: No defect or patent foramen ovale was identified. - Technically adequate study.    Assessment and Plan   1. Aortic aneurysm with severe AI  - s/p 27 mm ST Jude mechanical valve conduit (Bentall procedure) Apr 13, 2014 - continues to do well nearly 8 years from surgery - continue coumadin/ASA in setting of mechanical valve   2. 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

## 2023-06-30 NOTE — Patient Instructions (Signed)
 Continue warfarin 1 1/2 tablets daily except 1 tablet on Sundays and Wednesdays Continue greens/salads Recheck in 8 weeks

## 2023-07-30 ENCOUNTER — Encounter: Payer: Self-pay | Admitting: *Deleted

## 2023-08-11 ENCOUNTER — Other Ambulatory Visit: Payer: Self-pay | Admitting: Cardiology

## 2023-08-12 ENCOUNTER — Ambulatory Visit: Attending: Cardiology | Admitting: Cardiology

## 2023-08-12 ENCOUNTER — Encounter: Payer: Self-pay | Admitting: Cardiology

## 2023-08-12 VITALS — BP 120/70 | HR 72 | Ht 67.0 in | Wt 223.0 lb

## 2023-08-12 DIAGNOSIS — I10 Essential (primary) hypertension: Secondary | ICD-10-CM | POA: Diagnosis not present

## 2023-08-12 DIAGNOSIS — Z8679 Personal history of other diseases of the circulatory system: Secondary | ICD-10-CM

## 2023-08-12 DIAGNOSIS — Z952 Presence of prosthetic heart valve: Secondary | ICD-10-CM

## 2023-08-12 DIAGNOSIS — I351 Nonrheumatic aortic (valve) insufficiency: Secondary | ICD-10-CM

## 2023-08-12 NOTE — Patient Instructions (Signed)

## 2023-08-12 NOTE — Progress Notes (Signed)
 Clinical Summary Vincent [name] is a 63 y.o.male seen today for follow up of the following medical problems.      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 Walks up to a mile without issues.  - compliant with meds. No bleeding    2. HTN - compliant with meds - home bp's 120s/70s Past Medical History:  Diagnosis Date   Aortic root aneurysm (HCC)    Chronic systolic heart failure (HCC)      Allergies  Allergen Reactions   Latex Rash     Current Outpatient Medications  Medication Sig Dispense Refill   acetaminophen  (TYLENOL ) 500 MG tablet Take 500 mg by mouth every 6 (six) hours as needed.     aspirin  EC 81 MG tablet Take 1 tablet (81 mg total) by mouth daily.     lisinopril  (ZESTRIL ) 2.5 MG tablet TAKE 1 TABLET BY MOUTH EVERY DAY. 90 tablet 2   metoprolol  succinate (TOPROL -XL) 100 MG 24 hr tablet TAKE 1 TABLET BY MOUTH EVERY MORNING AND 1/2 TABLET IN THE EVENING 45 tablet 0   warfarin (COUMADIN ) 5 MG tablet TAKE 1 TO 1 & 1/2 TABLET BY MOUTH DAILY AS DIRECTED BY THE COUMADIN  CLINIC 130 tablet 1   No current facility-administered medications for this visit.     Past Surgical History:  Procedure Laterality Date   BENTALL PROCEDURE N/A 04/13/2014   Procedure: BENTALL PROCEDURE;  Surgeon: Vincent MARLA Fellers, MD;  Location: [facility] OR;  Service: Open Heart Surgery;  Laterality: N/A;   LEFT AND RIGHT HEART CATHETERIZATION WITH CORONARY ANGIOGRAM N/A 04/12/2014   Procedure: LEFT AND RIGHT HEART CATHETERIZATION WITH CORONARY ANGIOGRAM;  Surgeon: Vincent LELON Claudene DOUGLAS, MD;  Location: [facility] CATH LAB;  Service: Cardiovascular;  Laterality: N/A;   TEE WITHOUT CARDIOVERSION N/A 04/13/2014   Procedure: TRANSESOPHAGEAL ECHOCARDIOGRAM (TEE);   Surgeon: Vincent MARLA Fellers, MD;  Location: [facility] OR;  Service: Open Heart Surgery;  Laterality: N/A;     Allergies  Allergen Reactions   Latex Rash      Family History  Problem Relation Age of Onset   Diabetes Father    Diabetes Sister      Social History [name] reports that he quit smoking about 30 years ago. His smoking use included cigarettes. He started smoking about 42 years ago. He has a 3 pack-year smoking history. He quit smokeless tobacco use about 48 years ago.  His smokeless tobacco use included chew. [name] reports no history of alcohol use.     Physical Examination Today's Vitals   08/12/23 1545  BP: 120/70  Pulse: 72  SpO2: 97%  Weight: 223 lb (101.2 kg)  Height: 5' 7 (1.702 m)   Body mass index is 34.93 kg/m.  Gen: resting comfortably, no acute distress HEENT: no scleral icterus, pupils equal round and reactive, no palptable cervical adenopathy,  CV: RRR, no m/rg, no jvd Resp: Clear to auscultation bilaterally GI: abdomen is soft, non-tender, non-distended, normal bowel sounds, no hepatosplenomegaly MSK: extremities are warm, no edema.  Skin: warm, no rash Neuro:  no focal deficits Psych: appropriate affect   Diagnostic Studies   04/10/14 Echo Study Conclusions  - Left ventricle: The cavity size  was normal. Wall thickness was   normal. Systolic function was normal. The estimated ejection   fraction was in the range of 50% to 55%. The study is not   technically sufficient to allow evaluation of LV diastolic   function. - Aortic valve: Uncertain number of leaflets, cannot rule out AV   bicuspid valve. There was severe regurgitation. The AR vena   contracta is 0.8 cm. Valve area (VTI): 3.31 cm^2. Valve area   (Vmax): 3.11 cm^2. Valve area (Vmean): 2.83 cm^2. - Aorta: Aortic root dimension: 75 mm (ED). - Aortic root: The aortic root was severely dilated. The dilatation   is primarily of the right Sinus of valsalva. The sinotubular    junction, proximal ascending aorta, and aortic arch are poorly   visualized, cannot evaluate if aneurysmal. - Mitral valve: There was mild regurgitation. - Left atrium: The atrium was severely dilated. - Right ventricle: The cavity size was mildly dilated. - Right atrium: The atrium was moderately dilated. - Atrial septum: No defect or patent foramen ovale was identified. - Pulmonary arteries: Systolic pressure was moderately increased.   PA peak pressure: 50 mm Hg (S). - Technically difficult study.   04/21/14 Echo Study Conclusions  - Left ventricle: The cavity size was normal. Wall thickness was   normal. Systolic function was moderately reduced. The estimated   ejection fraction was in the range of 35% to 40%. Diffuse   hypokinesis. - Aortic valve: A mechanical prosthesis was present and functioning   normally. Peak velocity (S): 230 cm/s. - Aorta: Aortic root repair intact. - Right ventricle: Systolic function was moderately reduced. - Right atrium: The atrium was mildly dilated. - Pericardium, extracardiac: There was no pericardial effusion.  Impressions:  - When compared to prior evaluation, EF has decreased.     Jan 2016 Cath   HEMODYNAMICS:  Aortic pressure 109/59 mmHg; LV pressure 105/10 mmHg; LVEDP 23 mmHg; RA 8 mmHg; RV 46/11 mmHg; PA 50/23 mmHg; PCWP(mean) 23 mmHg; Cardiac Output 6.24 L/m; AV gradient none   ANGIOGRAPHIC DATA:   The left main coronary artery is normal.   The left anterior descending artery is transapical and widely patent. There are mid and distal luminal irregularities and there is some systolic compression in the mid LAD suggesting possible intramyocardial course. 2 large diagonals arise from the proximal to mid LAD.   The left circumflex artery is large and widely patent. The circumflex is dominant..   The right coronary artery is nondominant. No obstruction is noted..   ASCENDING AORTOGRAPHY: Markedly dilated aorta due to right coronary sinus  of Valsalva aneurysm. There is accompanying 3+ aortic regurgitation. There also appears to be mild enlargement of the ascending aorta.   LEFT VENTRICULOGRAM: Determined by regurgitation from aorta during aortography) in the 20 RAO projection and revealed of normal to mildly dilated LV cavity size with ejection fraction of 45-50%.     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 Significant aortic regurgitation (3+) by aortography 3. Widely patent coronary arteries 4. Low normal to mildly depressed left ventricular systolic function in the 45 to 50% range.     08/2014 Echo Study Conclusions  - Left ventricle: The cavity size was normal. Wall thickness was   normal. Systolic function was normal. The estimated ejection   fraction was in the range of 60% to 65%. Wall motion was normal;   there were no regional wall motion abnormalities. Left   ventricular diastolic function parameters were normal. - Aortic  valve: There is a 27 mm ST Jude mechanical valve conduit   in the AV/aortic root position. Normal functional mechanical AV.   There was no stenosis. There was no significant regurgitation.   Valve area (VTI): 2.03 cm^2. Valve area (Vmax): 2.06 cm^2. Valve   area (Vmean): 1.95 cm^2. - Aortic root: The aortic root was normal in size. There is a known   aortic conduit at the level of the aortic root. - Left atrium: The atrium was mildly dilated. - Atrial septum: No defect or patent foramen ovale was identified. - Technically adequate study.    Assessment and Plan   1. Aortic aneurysm with severe AI  - s/p 27 mm ST Jude mechanical valve conduit (Bentall procedure) Apr 13, 2014 - continue coumadin /ASA in setting of mechanical valve - doing well without any active issues, continue to monitor.    2. 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

## 2023-08-25 ENCOUNTER — Ambulatory Visit: Attending: Cardiology | Admitting: *Deleted

## 2023-08-25 DIAGNOSIS — Z5181 Encounter for therapeutic drug level monitoring: Secondary | ICD-10-CM

## 2023-08-25 DIAGNOSIS — Z952 Presence of prosthetic heart valve: Secondary | ICD-10-CM | POA: Diagnosis not present

## 2023-08-25 LAB — POCT INR: INR: 3.4 — AB (ref 2.0–3.0)

## 2023-08-25 NOTE — Patient Instructions (Signed)
 Hold warfarin tonight then resume 1 1/2 tablets daily except 1 tablet on Sundays and Wednesdays Continue greens/salads Recheck in 8 weeks

## 2023-09-07 ENCOUNTER — Other Ambulatory Visit: Payer: Self-pay | Admitting: Cardiology

## 2023-09-27 ENCOUNTER — Other Ambulatory Visit: Payer: Self-pay | Admitting: Cardiology

## 2023-10-20 ENCOUNTER — Ambulatory Visit: Attending: Cardiology | Admitting: *Deleted

## 2023-10-20 DIAGNOSIS — Z952 Presence of prosthetic heart valve: Secondary | ICD-10-CM

## 2023-10-20 DIAGNOSIS — Z5181 Encounter for therapeutic drug level monitoring: Secondary | ICD-10-CM

## 2023-10-20 LAB — POCT INR: INR: 2.1 (ref 2.0–3.0)

## 2023-10-20 NOTE — Patient Instructions (Signed)
 Continue warfarin 1 1/2 tablets daily except 1 tablet on Sundays and Wednesdays Continue greens/salads Recheck in 8 weeks

## 2023-12-15 ENCOUNTER — Ambulatory Visit: Attending: Cardiology | Admitting: *Deleted

## 2023-12-15 DIAGNOSIS — Z5181 Encounter for therapeutic drug level monitoring: Secondary | ICD-10-CM | POA: Diagnosis not present

## 2023-12-15 DIAGNOSIS — Z952 Presence of prosthetic heart valve: Secondary | ICD-10-CM

## 2023-12-15 LAB — POCT INR: INR: 2.7 (ref 2.0–3.0)

## 2023-12-15 NOTE — Progress Notes (Signed)
 INR 2.7; Please see anticoagulation encounter

## 2023-12-15 NOTE — Patient Instructions (Signed)
 Continue warfarin 1 1/2 tablets daily except 1 tablet on Sundays and Wednesdays Continue greens/salads Recheck in 8 weeks

## 2023-12-16 ENCOUNTER — Other Ambulatory Visit: Payer: Self-pay | Admitting: Cardiology

## 2023-12-16 NOTE — Telephone Encounter (Signed)
 Refill Request.

## 2024-02-07 ENCOUNTER — Other Ambulatory Visit: Payer: Self-pay | Admitting: Cardiology

## 2024-02-09 ENCOUNTER — Ambulatory Visit: Attending: Cardiology | Admitting: *Deleted

## 2024-02-09 DIAGNOSIS — Z5181 Encounter for therapeutic drug level monitoring: Secondary | ICD-10-CM

## 2024-02-09 DIAGNOSIS — Z952 Presence of prosthetic heart valve: Secondary | ICD-10-CM

## 2024-02-09 LAB — POCT INR: INR: 1.9 — AB (ref 2.0–3.0)

## 2024-02-09 NOTE — Progress Notes (Signed)
 INR 1.9; Please see anticoagulation encounter

## 2024-02-09 NOTE — Patient Instructions (Signed)
 Take warfarin 1 1/2 tablets tonight then resume 1 1/2 tablets daily except 1 tablet on Sundays and Wednesdays Continue greens/salads Recheck in 8 weeks

## 2024-03-27 ENCOUNTER — Other Ambulatory Visit: Payer: Self-pay | Admitting: Cardiology

## 2024-03-27 NOTE — Telephone Encounter (Signed)
 Refill request for warfarin:  Last INR was 1.9 on 02/09/24 Next INR due 04/05/24 LOV was 08/12/23  Refill approved.

## 2024-04-05 ENCOUNTER — Ambulatory Visit: Attending: Cardiology | Admitting: *Deleted

## 2024-04-05 DIAGNOSIS — Z952 Presence of prosthetic heart valve: Secondary | ICD-10-CM | POA: Diagnosis not present

## 2024-04-05 DIAGNOSIS — Z5181 Encounter for therapeutic drug level monitoring: Secondary | ICD-10-CM | POA: Diagnosis not present

## 2024-04-05 LAB — POCT INR: INR: 2.1 (ref 2.0–3.0)

## 2024-04-05 NOTE — Progress Notes (Signed)
 INR 2.1; Please see anticoagulation encounter

## 2024-04-05 NOTE — Patient Instructions (Signed)
 Continue warfarin 1 1/2 tablets daily except 1 tablet on Sundays and Wednesdays Continue greens/salads Recheck in 8 weeks

## 2024-05-31 ENCOUNTER — Ambulatory Visit
